# Patient Record
Sex: Female | Born: 1995 | Race: White | Hispanic: No | Marital: Single | Smoking: Never smoker
Health system: Southern US, Community
[De-identification: ages and names within clinical notes are randomized; demographics above are authoritative.]

## PROBLEM LIST (undated history)

## (undated) ENCOUNTER — Inpatient Hospital Stay (HOSPITAL_COMMUNITY): Payer: Self-pay

## (undated) DIAGNOSIS — F603 Borderline personality disorder: Secondary | ICD-10-CM

## (undated) DIAGNOSIS — F419 Anxiety disorder, unspecified: Secondary | ICD-10-CM

## (undated) DIAGNOSIS — F319 Bipolar disorder, unspecified: Secondary | ICD-10-CM

## (undated) DIAGNOSIS — F329 Major depressive disorder, single episode, unspecified: Secondary | ICD-10-CM

## (undated) DIAGNOSIS — R636 Underweight: Secondary | ICD-10-CM

## (undated) DIAGNOSIS — K219 Gastro-esophageal reflux disease without esophagitis: Secondary | ICD-10-CM

## (undated) DIAGNOSIS — F32A Depression, unspecified: Secondary | ICD-10-CM

## (undated) HISTORY — PX: NO PAST SURGERIES: SHX2092

## (undated) HISTORY — DX: Borderline personality disorder: F60.3

---

## 2015-06-13 ENCOUNTER — Other Ambulatory Visit: Payer: Self-pay | Admitting: Neurosurgery

## 2015-06-17 HISTORY — PX: BACK SURGERY: SHX140

## 2015-06-19 ENCOUNTER — Encounter (HOSPITAL_COMMUNITY)
Admission: RE | Admit: 2015-06-19 | Discharge: 2015-06-19 | Disposition: A | Discharge status: Home / Self Care | Payer: Managed Care, Other (non HMO) | Source: Ambulatory Visit | Attending: Neurosurgery | Admitting: Neurosurgery

## 2015-06-19 ENCOUNTER — Encounter (HOSPITAL_COMMUNITY): Payer: Self-pay

## 2015-06-19 DIAGNOSIS — Z01812 Encounter for preprocedural laboratory examination: Secondary | ICD-10-CM | POA: Insufficient documentation

## 2015-06-19 DIAGNOSIS — Z0183 Encounter for blood typing: Secondary | ICD-10-CM | POA: Diagnosis not present

## 2015-06-19 DIAGNOSIS — M4310 Spondylolisthesis, site unspecified: Secondary | ICD-10-CM | POA: Insufficient documentation

## 2015-06-19 HISTORY — DX: Underweight: R63.6

## 2015-06-19 HISTORY — DX: Anxiety disorder, unspecified: F41.9

## 2015-06-19 HISTORY — DX: Major depressive disorder, single episode, unspecified: F32.9

## 2015-06-19 HISTORY — DX: Bipolar disorder, unspecified: F31.9

## 2015-06-19 HISTORY — DX: Depression, unspecified: F32.A

## 2015-06-19 LAB — TYPE AND SCREEN
ABO/RH(D): O POS
Antibody Screen: NEGATIVE

## 2015-06-19 LAB — CBC WITH DIFFERENTIAL/PLATELET
Basophils Absolute: 0 10*3/uL (ref 0.0–0.1)
Basophils Relative: 0 %
EOS ABS: 0.1 10*3/uL (ref 0.0–0.7)
Eosinophils Relative: 1 %
HEMATOCRIT: 36.2 % (ref 36.0–46.0)
HEMOGLOBIN: 12.4 g/dL (ref 12.0–15.0)
LYMPHS ABS: 2 10*3/uL (ref 0.7–4.0)
LYMPHS PCT: 30 %
MCH: 28.8 pg (ref 26.0–34.0)
MCHC: 34.3 g/dL (ref 30.0–36.0)
MCV: 84 fL (ref 78.0–100.0)
MONOS PCT: 6 %
Monocytes Absolute: 0.4 10*3/uL (ref 0.1–1.0)
NEUTROS PCT: 63 %
Neutro Abs: 4.3 10*3/uL (ref 1.7–7.7)
Platelets: 280 10*3/uL (ref 150–400)
RBC: 4.31 MIL/uL (ref 3.87–5.11)
RDW: 11.6 % (ref 11.5–15.5)
WBC: 6.8 10*3/uL (ref 4.0–10.5)

## 2015-06-19 LAB — SURGICAL PCR SCREEN
MRSA, PCR: NEGATIVE
Staphylococcus aureus: POSITIVE — AB

## 2015-06-19 LAB — HCG, SERUM, QUALITATIVE: PREG SERUM: NEGATIVE

## 2015-06-19 LAB — ABO/RH: ABO/RH(D): O POS

## 2015-06-19 NOTE — Progress Notes (Signed)
[facility] called in [facility] in [place], [place] for positive PCR of Staph. Pt notified and voiced understanding.

## 2015-06-19 NOTE — Progress Notes (Signed)
PCP - Dr. Margo Commonapper Cardiologist - denies  EKG/CXR - denies Echo/stress test/cardiac cath - denies  Patient denies chest pain and shortness of breath at PAT appointment.

## 2015-06-19 NOTE — Pre-Procedure Instructions (Signed)
    Toni HutchingBrandi D Watson  06/19/2015      [facility] [address] - [place], [place] - [address] [place] [address] Phone: [phone] Fax: [phone]    Your procedure is scheduled on Monday, May 8th, 2017.  [facility] Admitting at 10:00 A.M.   Call this number if you have problems the morning of surgery:  [phone]   Remember:  Do not eat food or drink liquids after midnight.   Take these medicines the morning of surgery with A SIP OF WATER: None.  Stop taking: Aspirin, NSAIDS, Aleve, Naproxen, Ibuprofen, Advil, Motrin, BC's, Goody's, Fish oil, all herbal medications, and all vitamins.    Do not wear jewelry, make-up or nail polish.  Do not wear lotions, powders, or perfumes.  You may NOT wear deodorant.  Do not shave 48 hours prior to surgery.    Do not bring valuables to the hospital.   [facility] is not responsible for any belongings or valuables.  Contacts, dentures or bridgework may not be worn into surgery.  Leave your suitcase in the car.  After surgery it may be brought to your room.  For patients admitted to the hospital, discharge time will be determined by your treatment team.  Patients discharged the day of surgery will not be allowed to drive home.   Special instructions:  See attached.   Please read over the following fact sheets that you were given. Pain Booklet, Coughing and Deep Breathing, Blood Transfusion Information, MRSA Information and Surgical Site Infection Prevention

## 2015-06-24 ENCOUNTER — Observation Stay (HOSPITAL_COMMUNITY)
Admission: RE | Admit: 2015-06-24 | Discharge: 2015-06-25 | Disposition: A | Discharge status: Home / Self Care | Payer: Managed Care, Other (non HMO) | Source: Ambulatory Visit | Attending: Neurosurgery | Admitting: Neurosurgery

## 2015-06-24 ENCOUNTER — Inpatient Hospital Stay (HOSPITAL_COMMUNITY): Payer: Managed Care, Other (non HMO) | Admitting: Certified Registered Nurse Anesthetist

## 2015-06-24 ENCOUNTER — Encounter (HOSPITAL_COMMUNITY)
Admission: RE | Disposition: A | Discharge status: Home / Self Care | Payer: Self-pay | Source: Ambulatory Visit | Attending: Neurosurgery

## 2015-06-24 ENCOUNTER — Inpatient Hospital Stay (HOSPITAL_COMMUNITY): Payer: Managed Care, Other (non HMO)

## 2015-06-24 ENCOUNTER — Encounter (HOSPITAL_COMMUNITY): Payer: Self-pay | Admitting: *Deleted

## 2015-06-24 DIAGNOSIS — F329 Major depressive disorder, single episode, unspecified: Secondary | ICD-10-CM | POA: Insufficient documentation

## 2015-06-24 DIAGNOSIS — F419 Anxiety disorder, unspecified: Secondary | ICD-10-CM | POA: Diagnosis not present

## 2015-06-24 DIAGNOSIS — F319 Bipolar disorder, unspecified: Secondary | ICD-10-CM | POA: Diagnosis not present

## 2015-06-24 DIAGNOSIS — R262 Difficulty in walking, not elsewhere classified: Secondary | ICD-10-CM | POA: Insufficient documentation

## 2015-06-24 DIAGNOSIS — M4317 Spondylolisthesis, lumbosacral region: Principal | ICD-10-CM | POA: Insufficient documentation

## 2015-06-24 DIAGNOSIS — Z419 Encounter for procedure for purposes other than remedying health state, unspecified: Secondary | ICD-10-CM

## 2015-06-24 SURGERY — POSTERIOR LUMBAR FUSION 1 LEVEL
Anesthesia: General | Site: Back

## 2015-06-24 MED ORDER — SODIUM CHLORIDE 0.9 % IR SOLN
Status: DC | PRN
  Administered 2015-06-24: 500 mL

## 2015-06-24 MED ORDER — ACETAMINOPHEN 325 MG PO TABS: 650.0000 mg | ORAL_TABLET | ORAL | Status: DC | PRN

## 2015-06-24 MED ORDER — NEOSTIGMINE METHYLSULFATE 10 MG/10ML IV SOLN
INTRAVENOUS | Status: DC | PRN
  Administered 2015-06-24: 3 mg via INTRAVENOUS

## 2015-06-24 MED ORDER — CEFAZOLIN SODIUM-DEXTROSE 2-4 GM/100ML-% IV SOLN
INTRAVENOUS | Status: AC
  Administered 2015-06-24: 2 g via INTRAVENOUS
  Filled 2015-06-24: qty 100

## 2015-06-24 MED ORDER — PROPOFOL 10 MG/ML IV BOLUS
INTRAVENOUS | Status: DC | PRN
  Administered 2015-06-24: 200 mg via INTRAVENOUS

## 2015-06-24 MED ORDER — MIDAZOLAM HCL 5 MG/5ML IJ SOLN
INTRAMUSCULAR | Status: DC | PRN
  Administered 2015-06-24: 0.5 mg via INTRAVENOUS

## 2015-06-24 MED ORDER — HYDROMORPHONE HCL 1 MG/ML IJ SOLN
0.5000 mg | INTRAMUSCULAR | Status: DC | PRN
  Administered 2015-06-24: 1 mg via INTRAVENOUS
  Filled 2015-06-24: qty 1

## 2015-06-24 MED ORDER — ACETAMINOPHEN 10 MG/ML IV SOLN
INTRAVENOUS | Status: AC
  Administered 2015-06-24: 1000 mg via INTRAVENOUS
  Filled 2015-06-24: qty 100

## 2015-06-24 MED ORDER — DEXAMETHASONE SODIUM PHOSPHATE 10 MG/ML IJ SOLN
INTRAMUSCULAR | Status: AC
  Filled 2015-06-24: qty 1

## 2015-06-24 MED ORDER — ARTIFICIAL TEARS OP OINT
TOPICAL_OINTMENT | OPHTHALMIC | Status: DC | PRN
  Administered 2015-06-24: 1 via OPHTHALMIC

## 2015-06-24 MED ORDER — VANCOMYCIN HCL 1000 MG IV SOLR
INTRAVENOUS | Status: DC | PRN
  Administered 2015-06-24: 1000 mg

## 2015-06-24 MED ORDER — PHENOL 1.4 % MT LIQD: 1.0000 | OROMUCOSAL | Status: DC | PRN

## 2015-06-24 MED ORDER — GLYCOPYRROLATE 0.2 MG/ML IJ SOLN
INTRAMUSCULAR | Status: DC | PRN
  Administered 2015-06-24: .4 mg via INTRAVENOUS

## 2015-06-24 MED ORDER — SODIUM CHLORIDE 0.9% FLUSH
3.0000 mL | Freq: Two times a day (BID) | INTRAVENOUS | Status: DC
  Administered 2015-06-24: 3 mL via INTRAVENOUS

## 2015-06-24 MED ORDER — LACTATED RINGERS IV SOLN
INTRAVENOUS | Status: DC
Start: 2015-06-24 — End: 2015-06-24
  Administered 2015-06-24 (×3): via INTRAVENOUS

## 2015-06-24 MED ORDER — CEFAZOLIN SODIUM 1-5 GM-% IV SOLN
1.0000 g | Freq: Three times a day (TID) | INTRAVENOUS | Status: AC
  Administered 2015-06-24 – 2015-06-25 (×2): 1 g via INTRAVENOUS
  Filled 2015-06-24 (×2): qty 50

## 2015-06-24 MED ORDER — HYDROMORPHONE HCL 1 MG/ML IJ SOLN
0.5000 mg | INTRAMUSCULAR | Status: DC | PRN
  Administered 2015-06-24 (×2): 0.5 mg via INTRAVENOUS

## 2015-06-24 MED ORDER — PHENYLEPHRINE HCL 10 MG/ML IJ SOLN
INTRAMUSCULAR | Status: DC | PRN
  Administered 2015-06-24: 40 ug via INTRAVENOUS
  Administered 2015-06-24 (×2): 80 ug via INTRAVENOUS
  Administered 2015-06-24 (×5): 40 ug via INTRAVENOUS

## 2015-06-24 MED ORDER — SURGIFOAM 100 EX MISC
CUTANEOUS | Status: DC | PRN
  Administered 2015-06-24: 20 mL via TOPICAL

## 2015-06-24 MED ORDER — ACETAMINOPHEN 650 MG RE SUPP: 650.0000 mg | RECTAL | Status: DC | PRN

## 2015-06-24 MED ORDER — ONDANSETRON HCL 4 MG/2ML IJ SOLN
4.0000 mg | Freq: Once | INTRAMUSCULAR | Status: DC | PRN
Start: 2015-06-24 — End: 2015-06-24

## 2015-06-24 MED ORDER — KETOROLAC TROMETHAMINE 30 MG/ML IJ SOLN
INTRAMUSCULAR | Status: DC | PRN
  Administered 2015-06-24: 30 mg via INTRAVENOUS

## 2015-06-24 MED ORDER — FENTANYL CITRATE (PF) 100 MCG/2ML IJ SOLN
INTRAMUSCULAR | Status: DC | PRN
  Administered 2015-06-24: 50 ug via INTRAVENOUS
  Administered 2015-06-24 (×2): 25 ug via INTRAVENOUS
  Administered 2015-06-24: 100 ug via INTRAVENOUS

## 2015-06-24 MED ORDER — VANCOMYCIN HCL 1000 MG IV SOLR
INTRAVENOUS | Status: AC
  Filled 2015-06-24: qty 1000

## 2015-06-24 MED ORDER — MIDAZOLAM HCL 2 MG/2ML IJ SOLN
INTRAMUSCULAR | Status: AC
  Filled 2015-06-24: qty 2

## 2015-06-24 MED ORDER — OXYCODONE-ACETAMINOPHEN 5-325 MG PO TABS
1.0000 | ORAL_TABLET | ORAL | Status: DC | PRN
  Administered 2015-06-25 (×2): 2 via ORAL
  Filled 2015-06-24 (×2): qty 2

## 2015-06-24 MED ORDER — ONDANSETRON HCL 4 MG/2ML IJ SOLN
4.0000 mg | INTRAMUSCULAR | Status: DC | PRN
  Administered 2015-06-24: 4 mg via INTRAVENOUS
  Filled 2015-06-24: qty 2

## 2015-06-24 MED ORDER — ONDANSETRON HCL 4 MG/2ML IJ SOLN
INTRAMUSCULAR | Status: DC | PRN
  Administered 2015-06-24: 4 mg via INTRAVENOUS

## 2015-06-24 MED ORDER — MENTHOL 3 MG MT LOZG: 1.0000 | LOZENGE | OROMUCOSAL | Status: DC | PRN

## 2015-06-24 MED ORDER — DIAZEPAM 5 MG PO TABS
5.0000 mg | ORAL_TABLET | Freq: Four times a day (QID) | ORAL | Status: DC | PRN
  Administered 2015-06-25: 5 mg via ORAL
  Filled 2015-06-24: qty 1

## 2015-06-24 MED ORDER — 0.9 % SODIUM CHLORIDE (POUR BTL) OPTIME
TOPICAL | Status: DC | PRN
  Administered 2015-06-24: 1000 mL

## 2015-06-24 MED ORDER — LIDOCAINE HCL (CARDIAC) 20 MG/ML IV SOLN
INTRAVENOUS | Status: DC | PRN
  Administered 2015-06-24: 40 mg via INTRAVENOUS

## 2015-06-24 MED ORDER — DEXAMETHASONE SODIUM PHOSPHATE 10 MG/ML IJ SOLN: 10.0000 mg | INTRAMUSCULAR | Status: DC

## 2015-06-24 MED ORDER — BUPIVACAINE HCL (PF) 0.25 % IJ SOLN
INTRAMUSCULAR | Status: DC | PRN
  Administered 2015-06-24: 20 mL

## 2015-06-24 MED ORDER — HYDROCODONE-ACETAMINOPHEN 5-325 MG PO TABS: 1.0000 | ORAL_TABLET | ORAL | Status: DC | PRN

## 2015-06-24 MED ORDER — SODIUM CHLORIDE 0.9% FLUSH: 3.0000 mL | INTRAVENOUS | Status: DC | PRN

## 2015-06-24 MED ORDER — FENTANYL CITRATE (PF) 250 MCG/5ML IJ SOLN
INTRAMUSCULAR | Status: AC
  Filled 2015-06-24: qty 5

## 2015-06-24 MED ORDER — PROPOFOL 10 MG/ML IV BOLUS
INTRAVENOUS | Status: AC
  Filled 2015-06-24: qty 20

## 2015-06-24 MED ORDER — ROCURONIUM BROMIDE 100 MG/10ML IV SOLN
INTRAVENOUS | Status: DC | PRN
  Administered 2015-06-24: 30 mg via INTRAVENOUS
  Administered 2015-06-24: 20 mg via INTRAVENOUS

## 2015-06-24 MED ORDER — NORETHINDRONE ACET-ETHINYL EST 1-20 MG-MCG PO TABS: 1.0000 | ORAL_TABLET | Freq: Every day | ORAL | Status: DC

## 2015-06-24 MED ORDER — HYDROMORPHONE HCL 1 MG/ML IJ SOLN
INTRAMUSCULAR | Status: AC
  Filled 2015-06-24: qty 1

## 2015-06-24 MED ORDER — CEFAZOLIN SODIUM-DEXTROSE 2-4 GM/100ML-% IV SOLN: 2.0000 g | INTRAVENOUS | Status: DC

## 2015-06-24 SURGICAL SUPPLY — 58 items
BAG DECANTER FOR FLEXI CONT (MISCELLANEOUS) ×3 IMPLANT
BENZOIN TINCTURE PRP APPL 2/3 (GAUZE/BANDAGES/DRESSINGS) ×3 IMPLANT
BLADE CLIPPER SURG (BLADE) IMPLANT
BRUSH SCRUB EZ PLAIN DRY (MISCELLANEOUS) ×3 IMPLANT
BUR CUTTER 7.0 ROUND (BURR) IMPLANT
BUR MATCHSTICK NEURO 3.0 LAGG (BURR) ×3 IMPLANT
CANISTER SUCT 3000ML PPV (MISCELLANEOUS) ×3 IMPLANT
CAP LCK SPNE (Orthopedic Implant) ×4 IMPLANT
CAP LOCK SPINE RADIUS (Orthopedic Implant) ×4 IMPLANT
CAP LOCKING (Orthopedic Implant) ×8 IMPLANT
CLOSURE WOUND 1/2 X4 (GAUZE/BANDAGES/DRESSINGS) ×1
CONT SPEC 4OZ CLIKSEAL STRL BL (MISCELLANEOUS) ×3 IMPLANT
COVER BACK TABLE 60X90IN (DRAPES) ×3 IMPLANT
DECANTER SPIKE VIAL GLASS SM (MISCELLANEOUS) ×3 IMPLANT
DEVICE INTERBODY ELEVATE 23X10 (Cage) ×6 IMPLANT
DRAPE C-ARM 42X72 X-RAY (DRAPES) ×6 IMPLANT
DRAPE LAPAROTOMY 100X72X124 (DRAPES) ×3 IMPLANT
DRAPE POUCH INSTRU U-SHP 10X18 (DRAPES) ×3 IMPLANT
DRAPE PROXIMA HALF (DRAPES) IMPLANT
DRAPE SURG 17X23 STRL (DRAPES) ×12 IMPLANT
DRSG OPSITE POSTOP 4X6 (GAUZE/BANDAGES/DRESSINGS) ×3 IMPLANT
DURAPREP 26ML APPLICATOR (WOUND CARE) ×3 IMPLANT
ELECT REM PT RETURN 9FT ADLT (ELECTROSURGICAL) ×3
ELECTRODE REM PT RTRN 9FT ADLT (ELECTROSURGICAL) ×1 IMPLANT
EVACUATOR 1/8 PVC DRAIN (DRAIN) ×3 IMPLANT
GAUZE SPONGE 4X4 12PLY STRL (GAUZE/BANDAGES/DRESSINGS) ×3 IMPLANT
GAUZE SPONGE 4X4 16PLY XRAY LF (GAUZE/BANDAGES/DRESSINGS) IMPLANT
GLOVE ECLIPSE 9.0 STRL (GLOVE) ×6 IMPLANT
GLOVE EXAM NITRILE LRG STRL (GLOVE) IMPLANT
GLOVE EXAM NITRILE MD LF STRL (GLOVE) IMPLANT
GLOVE EXAM NITRILE XL STR (GLOVE) IMPLANT
GLOVE EXAM NITRILE XS STR PU (GLOVE) IMPLANT
GOWN STRL REUS W/ TWL LRG LVL3 (GOWN DISPOSABLE) IMPLANT
GOWN STRL REUS W/ TWL XL LVL3 (GOWN DISPOSABLE) ×2 IMPLANT
GOWN STRL REUS W/TWL 2XL LVL3 (GOWN DISPOSABLE) IMPLANT
GOWN STRL REUS W/TWL LRG LVL3 (GOWN DISPOSABLE)
GOWN STRL REUS W/TWL XL LVL3 (GOWN DISPOSABLE) ×4
KIT BASIN OR (CUSTOM PROCEDURE TRAY) ×3 IMPLANT
KIT ROOM TURNOVER OR (KITS) ×3 IMPLANT
LIQUID BAND (GAUZE/BANDAGES/DRESSINGS) ×3 IMPLANT
NEEDLE HYPO 22GX1.5 SAFETY (NEEDLE) ×3 IMPLANT
NS IRRIG 1000ML POUR BTL (IV SOLUTION) ×3 IMPLANT
PACK LAMINECTOMY NEURO (CUSTOM PROCEDURE TRAY) ×3 IMPLANT
ROD RADIUS 35MM (Rod) ×3 IMPLANT
ROD RADIUS 40MM (Neuro Prosthesis/Implant) ×2 IMPLANT
ROD SPNL 40X5.5XNS TI RDS (Neuro Prosthesis/Implant) ×1 IMPLANT
SCREW 5.75X40M (Screw) ×6 IMPLANT
SCREW 6.75X40MM (Screw) ×6 IMPLANT
SPONGE SURGIFOAM ABS GEL 100 (HEMOSTASIS) ×3 IMPLANT
STRIP CLOSURE SKIN 1/2X4 (GAUZE/BANDAGES/DRESSINGS) ×2 IMPLANT
SUT VIC AB 0 CT1 18XCR BRD8 (SUTURE) ×2 IMPLANT
SUT VIC AB 0 CT1 8-18 (SUTURE) ×4
SUT VIC AB 2-0 CT1 18 (SUTURE) ×3 IMPLANT
SUT VIC AB 3-0 SH 8-18 (SUTURE) ×6 IMPLANT
TOWEL OR 17X24 6PK STRL BLUE (TOWEL DISPOSABLE) ×3 IMPLANT
TOWEL OR 17X26 10 PK STRL BLUE (TOWEL DISPOSABLE) ×3 IMPLANT
TRAY FOLEY W/METER SILVER 14FR (SET/KITS/TRAYS/PACK) ×3 IMPLANT
WATER STERILE IRR 1000ML POUR (IV SOLUTION) ×3 IMPLANT

## 2015-06-24 NOTE — Anesthesia Procedure Notes (Signed)
Procedure Name: Intubation Date/Time: 06/24/2015 1:22 PM Performed by: Dairl PonderJIANG, Evonda Enge Pre-anesthesia Checklist: Patient identified, Timeout performed, Emergency Drugs available, Suction available and Patient being monitored Patient Re-evaluated:Patient Re-evaluated prior to inductionOxygen Delivery Method: Circle system utilized Preoxygenation: Pre-oxygenation with 100% oxygen Intubation Type: IV induction Ventilation: Mask ventilation with difficulty Laryngoscope Size: Mac and 3 Grade View: Grade I Tube type: Oral Tube size: 7.0 mm Number of attempts: 1 Airway Equipment and Method: Stylet Placement Confirmation: ETT inserted through vocal cords under direct vision,  positive ETCO2 and breath sounds checked- equal and bilateral Secured at: 22 cm Tube secured with: Tape Dental Injury: Teeth and Oropharynx as per pre-operative assessment

## 2015-06-24 NOTE — Anesthesia Postprocedure Evaluation (Signed)
Anesthesia Post Note  Patient: Donnetta HutchingBrandi D Cunning  Procedure(s) Performed: Procedure(s) (LRB): Posterior Lumbar Interbody Fusion Lumbar five-Sacral one (N/A)  Patient location during evaluation: PACU Anesthesia Type: General Level of consciousness: awake and alert Pain management: pain level controlled Vital Signs Assessment: post-procedure vital signs reviewed and stable Respiratory status: spontaneous breathing, nonlabored ventilation, respiratory function stable and patient connected to nasal cannula oxygen Cardiovascular status: blood pressure returned to baseline and stable Postop Assessment: no signs of nausea or vomiting Anesthetic complications: no    Last Vitals:  Filed Vitals:   06/24/15 1700 06/24/15 1715  BP: 107/59 111/67  Pulse: 77 93  Temp:    Resp: 18 17    Last Pain:  Filed Vitals:   06/24/15 1719  PainSc: 7     LLE Motor Response: Purposeful movement;Responds to commands (06/24/15 1715)   RLE Motor Response: Purposeful movement;Responds to commands (06/24/15 1715)        Kassity Woodson,W. EDMOND

## 2015-06-24 NOTE — Transfer of Care (Signed)
Immediate Anesthesia Transfer of Care Note  Patient: Toni Watson  Procedure(s) Performed: Procedure(s): Posterior Lumbar Interbody Fusion Lumbar five-Sacral one (N/A)  Patient Location: PACU  Anesthesia Type:General  Level of Consciousness: awake, alert  and oriented  Airway & Oxygen Therapy: Patient Spontanous Breathing and Patient connected to nasal cannula oxygen  Post-op Assessment: Report given to RN and Post -op Vital signs reviewed and stable  Post vital signs: Reviewed and stable  Last Vitals:  Filed Vitals:   06/24/15 1145 06/24/15 1619  BP: 130/85 116/67  Pulse: 93 90  Temp: 36.6 C 36.5 C  Resp: 18 14    Last Pain:  Filed Vitals:   06/24/15 1620  PainSc: 4          Complications: No apparent anesthesia complications

## 2015-06-24 NOTE — Op Note (Signed)
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  Surgeon:Kyelle Urbas A.Allecia Bells, M.D.  Asst. Surgeon: Ditty  Anesthesia: General  Indication: 20 year old female with back and bilateral lower extremity pain failing conservative management. Workup demonstrates evidence of a dysplastic spondylolisthesis with severe stenosis at L5-S1. Patient presents now for decompression and fusion in hopes of improving her symptoms.  Operative note: After induction of anesthesia, patient position prone onto Wilson frame and appropriately padded. Lumbar region prepped and draped sterilely. Incision made overlying L5-S1. Dissection performed bilaterally. Retractor placed. Fluoroscopy used. Level confirmed. The posterior elements of L5 and S1 were misshapen and dysplastic. Decompressive laminectomy and facetectomies were performed bilaterally of L5. Superior facetectomies of S1 were also performed. Ligament flavum and pannus-like material was elevated and resected. Aggressive foraminotomies were performed along the course the exiting L5 and S1 nerve roots bilaterally. Disc space was isolated and is incised. Discectomies then performed with various instruments to remove all elements of the disc and disc herniation at L5-S1. Pedicles of L5 and S1 were done 5 using surface landmarks and intraoperative fluoroscopy. Superficial bone overlying the pedicle was removed using high-speed drill. Each pedicle was then probed using a pedicle awl  each pedicle awl track was tapped. Radius brand screws from [employer] were placed bilaterally at L5 and S1. Disc space was prepared for interbody fusion. Disc space was distracted up to 10 mm. With a 10 mm distractor left and patient's left side thecal sac and nerve roots were protected on the contralateral side. A 10 mm x 12 expandable cage packed with local autograft was impacted into place and expanded into its full extent. Distractor was removed patient's left side. Disc space was once again prepared on the left. Morselize autograft was packed into the interspace. A second cage packed with autograft was impacted in place and expanded to its full extent. Both cages were centrally flush with the posterior margin of L5. It was difficult to get them to advance further than this. Wounds and irrigated with and like solution. Final images revealed good position the cages and the screws at the proper upper level with normal alignment is fine. Transverse processes and residual facets were decorticated. Morselize autograft was packed posterior laterally. Short segment titanium rods and placed over the screw heads at L5  and S1. Locking caps and placed over the screws. Locking caps and engaged with the construct under compression. Vancomycin powder was placed the deep wound space. Wounds and close in layers with Vicryl sutures. Steri-Strips and sterile dressing were applied. There were no apparent complications. Patient tolerated the procedure well and she returns to the recovery room postop.

## 2015-06-24 NOTE — Anesthesia Preprocedure Evaluation (Signed)
Anesthesia Evaluation  Patient identified by MRN, date of birth, ID band Patient awake    Reviewed: Allergy & Precautions, NPO status , Patient's Chart, lab work & pertinent test results  Airway Mallampati: I  TM Distance: >3 FB Neck ROM: Full    Dental   Pulmonary    Pulmonary exam normal        Cardiovascular Normal cardiovascular exam     Neuro/Psych Depression Bipolar Disorder    GI/Hepatic   Endo/Other    Renal/GU      Musculoskeletal   Abdominal   Peds  Hematology   Anesthesia Other Findings   Reproductive/Obstetrics                             Anesthesia Physical Anesthesia Plan  ASA: I  Anesthesia Plan: General   Post-op Pain Management:    Induction: Intravenous  Airway Management Planned: Oral ETT  Additional Equipment:   Intra-op Plan:   Post-operative Plan: Extubation in OR  Informed Consent: I have reviewed the patients History and Physical, chart, labs and discussed the procedure including the risks, benefits and alternatives for the proposed anesthesia with the patient or authorized representative who has indicated his/her understanding and acceptance.     Plan Discussed with: CRNA, Anesthesiologist and Surgeon  Anesthesia Plan Comments:         Anesthesia Quick Evaluation

## 2015-06-24 NOTE — H&P (Signed)
  Toni Watson is an 20 y.o. female.   Chief Complaint: Back pain HPI: 20 year old female with severe back and bilateral lower extremity pain failing conservative management. Workup demonstrates evidence of a dysplastic spondylolisthesis at L5-S1 with severe stenosis. Patient has failed conservative management presents now for decompression and fusion.  Past Medical History  Diagnosis Date  . Underweight     per patient  . Bipolar disorder (HCC)   . Depression   . Anxiety     Past Surgical History  Procedure Laterality Date  . No past surgeries      No family history on file. Social History:  reports that she has never smoked. She has never used smokeless tobacco. She reports that she does not drink alcohol or use illicit drugs.  Allergies: Not on File  No prescriptions prior to admission    No results found for this or any previous visit (from the past 48 hour(s)). No results found.  Pertinent items noted in HPI and remainder of comprehensive ROS otherwise negative.  There were no vitals taken for this visit.  The patient is awake and alert. She is oriented and appropriate. Cranial nerve function is intact. Motor and sensory function extremities are normal. Chest and abdomen are benign. Extremities are free from injury or deformity. 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 Plan bilateral L5-S1 decompressive laminectomy and foraminotomies followed by posterior lumbar interbody fusion utilizing expandable cages and local autografting coupled with posterior lateral arthrodesis utilizing nonsegmental pedicle screw fixation and local autografting. Risks and benefits been explained. Patient wishes to proceed.  Daurice Ovando A 06/24/2015, 8:55 AM

## 2015-06-24 NOTE — Brief Op Note (Signed)
06/24/2015  4:02 PM  PATIENT:  Toni Watson  20 y.o. female  PRE-OPERATIVE DIAGNOSIS:  Spondylolisthesis  POST-OPERATIVE DIAGNOSIS:  Spondylolisthesis  PROCEDURE:  Procedure(s): Posterior Lumbar Interbody Fusion Lumbar five-Sacral one (N/A)  SURGEON:  Surgeon(s) and Role:    * Julio SicksHenry Jarae Panas, MD - Primary    * Loura HaltBenjamin Jared Ditty, MD - Assisting  PHYSICIAN ASSISTANT:   ASSISTANTS:    ANESTHESIA:   general  EBL:  Total I/O In: 1000 [I.V.:1000] Out: 250 [Urine:150; Blood:100]  BLOOD ADMINISTERED:none  DRAINS: none   LOCAL MEDICATIONS USED:  MARCAINE     SPECIMEN:  No Specimen  DISPOSITION OF SPECIMEN:  N/A  COUNTS:  YES  TOURNIQUET:  * No tourniquets in log *  DICTATION: .Dragon Dictation  PLAN OF CARE: Admit to inpatient   PATIENT DISPOSITION:  PACU - hemodynamically stable.   Delay start of Pharmacological VTE agent (>24hrs) due to surgical blood loss or risk of bleeding: yes

## 2015-06-24 NOTE — Progress Notes (Signed)
When removing earring on left earlobe that patient reports that she was unable to remove. Noted slight bleeding post removal. Cleaned area with alcohol. Patient with 2- 3 mm abrasion to left ear. Victorino DikeJennifer in neuro Or notified

## 2015-06-25 DIAGNOSIS — M4317 Spondylolisthesis, lumbosacral region: Secondary | ICD-10-CM | POA: Diagnosis not present

## 2015-06-25 MED ORDER — OXYCODONE-ACETAMINOPHEN 5-325 MG PO TABS: 1.0000 | ORAL_TABLET | ORAL | Status: DC | PRN

## 2015-06-25 MED ORDER — DIAZEPAM 5 MG PO TABS: 5.0000 mg | ORAL_TABLET | Freq: Four times a day (QID) | ORAL | Status: DC | PRN

## 2015-06-25 NOTE — Progress Notes (Signed)
Pt doing well. Pt and father given D/C instructions with Rx's, verbal understanding was provided. Pt's incision is clean and dry with no sign of infection. Pt's IV was removed prior to D/C. Pt D/C'd home via wheelchair @ 1440 per MD order. Pt is stable @ D/C and has no other needs at this time. Rema FendtAshley Lindsy Cerullo, RN

## 2015-06-25 NOTE — Evaluation (Signed)
Occupational Therapy Evaluation Patient Details Name: Toni Watson MRN: [id number] DOB: [birth year] Today's Date: 06/25/2015    History of Present Illness Pt is a 20 y/o female who presents s/p L5-S1 PLIF on 06/24/15.   Clinical Impression   Patient evaluated by Occupational Therapy with no further acute OT needs identified. All education has been completed and the patient has no further questions. See below for any follow-up Occupational Therapy or equipment needs. OT to sign off. Thank you for referral.      Follow Up Recommendations  No OT follow up    Equipment Recommendations  None recommended by OT    Recommendations for Other Services       Precautions / Restrictions Precautions Precautions: Fall;Back Precaution Booklet Issued: Yes (comment) Precaution Comments: Reviewed handout.  Restrictions Weight Bearing Restrictions: No      Mobility Bed Mobility Overal bed mobility: Modified Independent             General bed mobility comments: Pt received walking in hall with OT  Transfers Overall transfer level: Modified independent Equipment used: None             General transfer comment: VC's for hand placement on seated surface for safety and maintenance of back precautions.     Balance Overall balance assessment: No apparent balance deficits (not formally assessed)                                          ADL Overall ADL's : Modified independent                                       General ADL Comments: educated on peri hygiene with back precautions. educated on use of a robe for bathing in case father needs to (A). Pt able to don doff brace independent     Vision Vision Assessment?: No apparent visual deficits   Perception     Praxis      Pertinent Vitals/Pain Pain Assessment: No/denies pain Faces Pain Scale: Hurts a little bit Pain Location: Incision, R glute Pain Descriptors / Indicators: Operative  site guarding;Sore Pain Intervention(s): Limited activity within patient's tolerance;Monitored during session;Repositioned     Hand Dominance Right   Extremity/Trunk Assessment Upper Extremity Assessment Upper Extremity Assessment: Overall WFL for tasks assessed   Lower Extremity Assessment Lower Extremity Assessment: Defer to PT evaluation   Cervical / Trunk Assessment Cervical / Trunk Assessment: Normal   Communication Communication Communication: No difficulties   Cognition Arousal/Alertness: Awake/alert Behavior During Therapy: WFL for tasks assessed/performed Overall Cognitive Status: Within Functional Limits for tasks assessed                     General Comments       Exercises       Shoulder Instructions      Home Living Family/patient expects to be discharged to:: Private residence Living Arrangements: Parent Available Help at Discharge: Family Type of Home: House Home Access: Stairs to enter Secretary/administratorntrance Stairs-Number of Steps: 2 Entrance Stairs-Rails: None Home Layout: One level     Bathroom Shower/Tub: Chief Strategy OfficerTub/shower unit   Bathroom Toilet: Standard     Home Equipment: None          Prior Functioning/Environment Level of Independence: Independent  OT Diagnosis:     OT Problem List:     OT Treatment/Interventions:      OT Goals(Current goals can be found in the care plan section)    OT Frequency:     Barriers to D/C:            Co-evaluation              End of Session Equipment Utilized During Treatment: Gait belt  Activity Tolerance: Patient tolerated treatment well Patient left: Other (comment) (PT Toni Watson in hall)   Time: 1610-9604 OT Time Calculation (min): 22 min Charges:  OT General Charges $OT Visit: 1 Procedure OT Evaluation $OT Eval Low Complexity: 1 Procedure G-Codes:    Toni Watson 07/02/15, 1:30 PM   Toni Watson   OTR/L Pager: [phone] Office: [phone] .

## 2015-06-25 NOTE — Evaluation (Signed)
Physical Therapy Evaluation and Discharge Patient Details Name: Toni Watson MRN: [id number] DOB: [birth year] Today's Date: 06/25/2015   History of Present Illness  Pt is a 20 y/o female who presents s/p L5-S1 PLIF on 06/24/15.  Clinical Impression  Patient evaluated by Physical Therapy with no further acute PT needs identified. All education has been completed and the patient has no further questions. At the time of PT eval pt was able to perform transfers and ambulation with modified independence. Pt moving slow and guarded due to reported fear of pain, however overall functioning well. See below for any follow-up Physical Therapy or equipment needs. PT is signing off. Thank you for this referral.     Follow Up Recommendations Outpatient PT;Supervision for mobility/OOB    Equipment Recommendations  None recommended by PT    Recommendations for Other Services       Precautions / Restrictions Precautions Precautions: Fall;Back Precaution Booklet Issued: Yes (comment) Precaution Comments: Reviewed handout. Pt was cued for precautions during functional mobility.  Restrictions Weight Bearing Restrictions: No      Mobility  Bed Mobility               General bed mobility comments: Pt received walking in hall with OT  Transfers Overall transfer level: Modified independent Equipment used: None             General transfer comment: VC's for hand placement on seated surface for safety and maintenance of back precautions.   Ambulation/Gait Ambulation/Gait assistance: Modified independent (Device/Increase time) Ambulation Distance (Feet): 400 Feet Assistive device: None Gait Pattern/deviations: Step-through pattern;Decreased stride length;Narrow base of support Gait velocity: Decreased Gait velocity interpretation: Below normal speed for age/gender General Gait Details: Slow and guarded. No unsteadiness or LOB noted. Pt states she is "scared of the pain" and was noted  taking very narrow, short steps. Was able to improve somewhat with cues but continued to appear very guarded.   Stairs Stairs: Yes Stairs assistance: Modified independent (Device/Increase time) Stair Management: No rails;Step to pattern;Forwards Number of Stairs: 3 General stair comments: VC's for general sequencing and safety.  Wheelchair Mobility    Modified Rankin (Stroke Patients Only)       Balance Overall balance assessment: No apparent balance deficits (not formally assessed)                                           Pertinent Vitals/Pain Pain Assessment: Faces Faces Pain Scale: Hurts a little bit Pain Location: Incision, R glute Pain Descriptors / Indicators: Operative site guarding;Sore Pain Intervention(s): Limited activity within patient's tolerance;Monitored during session;Repositioned    Home Living         Home Access: Stairs to enter Entrance Stairs-Rails: None Entrance Stairs-Number of Steps: 2 Home Layout: One level        Prior Function Level of Independence: Independent               Hand Dominance        Extremity/Trunk Assessment   Upper Extremity Assessment: Defer to OT evaluation           Lower Extremity Assessment: Overall WFL for tasks assessed      Cervical / Trunk Assessment: Normal (lumbar surgical incision)  Communication   Communication: No difficulties  Cognition Arousal/Alertness: Awake/alert Behavior During Therapy: WFL for tasks assessed/performed Overall Cognitive Status: Within Functional Limits for tasks assessed  General Comments      Exercises        Assessment/Plan    PT Assessment Patent does not need any further PT services  PT Diagnosis Difficulty walking   PT Problem List    PT Treatment Interventions     PT Goals (Current goals can be found in the Care Plan section) Acute Rehab PT Goals PT Goal Formulation: All assessment and education  complete, DC therapy    Frequency     Barriers to discharge        Co-evaluation               End of Session Equipment Utilized During Treatment: Back brace Activity Tolerance: Patient tolerated treatment well Patient left: in chair;with call bell/phone within reach;with family/visitor present Nurse Communication: Mobility status         Time: 1610-9604 PT Time Calculation (min) (ACUTE ONLY): 22 min   Charges:   PT Evaluation $PT Eval Moderate Complexity: 1 Procedure     PT G Codes:        Conni Slipper Jul 20, 2015, 12:26 PM   Conni Slipper, PT, DPT Acute Rehabilitation Services Pager: [phone]

## 2015-06-25 NOTE — Discharge Instructions (Signed)
Wound Care °Keep incision covered and dry for two days.  If you shower, cover incision with plastic wrap.  °Do not put any creams, lotions, or ointments on incision. °Leave steri-strips on back.  They will fall off by themselves. °Activity °Walk each and every day, increasing distance each day. °No lifting greater than 5 lbs.  Avoid excessive neck motion. °No driving for 2 weeks; may ride as a passenger locally. °If provided with back brace, wear when out of bed.  It is not necessary to wear brace in bed. °Diet °Resume your normal diet.  °Return to Work °Will be discussed at you follow up appointment. °Call Your Doctor If Any of These Occur °Redness, drainage, or swelling at the wound.  °Temperature greater than 101 degrees. °Severe pain not relieved by pain medication. °Incision starts to come apart. °Follow Up Appt °Call today for appointment in 1-2 weeks ([phone]) or for problems.  If you have any hardware placed in your spine, you will need an x-ray before your appointment. ° °Spinal Fusion °Spinal fusion is a procedure to make 2 or more of the bones in your spinal column (vertebrae) grow together (fuse). This procedure stops movement between the vertebrae and can relieve pain and prevent deformity.  °Spinal fusion is used to treat the following conditions: °· Fractures of the spine. °· Herniated disk (the spongy material [cartilage] between the vertebrae). °· Abnormal curvatures of the spine, such as scoliosis or kyphosis. °· A weak or an unstable spine, caused by infections or tumor. °RISKS AND COMPLICATIONS °Complications associated with spinal fusion are rare, but they can occur. Possible complications include: °· Bleeding. °· Infection near the incision. °· Nerve damage. Signs of nerve damage are back pain, pain in one or both legs, weakness, or numbness. °· Spinal fluid leakage. °· Blood clot in your leg, which can move to your lungs. °· Difficulty controlling urination or bowel movements. °BEFORE THE  PROCEDURE °· A medical evaluation will be done. This will include a physical exam, blood tests, and imaging exams. °· You will talk with an anesthesiologist. This is the person who will be in charge of the anesthesia during the procedure. Spinal fusion usually requires that you are asleep during the procedure (general anesthesia). °· You will need to stop taking certain medicines, particularly those associated with an increased risk of bleeding. Ask your caregiver about changing or stopping your regular medicines. °· If you smoke, you will need to stop at least 2 weeks before the procedure. Smoking can slow down the healing process, especially fusion of the vertebrae, and increase the risk of complications. °· Do not eat or drink anything for at least 8 hours before the procedure. °PROCEDURE  °A cut (incision) is made over the vertebrae that will be fused. The back muscles are separated from the vertebrae. If you are having this procedure to treat a herniated disk, the disc material pressing on the nerve root is removed (decompression). The area where the disk is removed is then filled with extra bone. Bone from another part of your body (autogenous bone) or bone from a bone donor (allograft bone) may be used. The extra bone promotes fusion between the vertebrae. Sometimes, specific medicines are added to the fusion area to promote bone healing. In most cases, screws and rods or metal plates will be used to attach the vertebrae to stabilize them while they fuse.  °AFTER THE PROCEDURE  °· You will stay in a recovery area until the anesthesia has worn off.   Your blood pressure and pulse will be checked frequently. °· You will be given antibiotics to prevent infection. °· You may continue to receive fluids through an intravenous (IV) tube while you are still in the hospital. °· Pain after surgery is normal. You will be given pain medicine. °· You will be taught how to move correctly and how to stand and walk. While in  bed, you will be instructed to turn frequently, using a "log rolling" technique, in which the entire body is moved without twisting the back. °  °This information is not intended to replace advice given to you by your health care provider. Make sure you discuss any questions you have with your health care provider. °  °Document Released: 11/01/2002 Document Revised: 04/27/2011 Document Reviewed: 07/18/2014 °Elsevier Interactive Patient Education ©2016 [employer] ° °

## 2015-06-25 NOTE — Discharge Summary (Signed)
Physician Discharge Summary  Patient ID: Donnetta HutchingBrandi D Parkin MRN: [id number] DOB/AGE: 10/23/1995 20 y.o.  Admit date: 06/24/2015 Discharge date: 06/25/2015  Admission Diagnoses:  Discharge Diagnoses:  Active Problems:   Spondylolisthesis at L5-S1 level   Discharged Condition: good  Hospital Course: Patient admitted the hospital where she underwent an uncomplicated L5-S1 decompression infusion. Postoperative she is doing well. Preoperative back pain improved. No lower extremity pain. Ambulating with minimal difficulty. Ready for discharge home.  Consults:   Significant Diagnostic Studies:   Treatments:   Discharge Exam: Blood pressure 98/56, pulse 74, temperature 98.4 F (36.9 C), temperature source Oral, resp. rate 18, last menstrual period 06/15/2015, SpO2 100 %. Awake and alert. Oriented and appropriate. Motor and sensory function intact. Wound clean and dry. Chest and abdomen benign.  Disposition: Final discharge disposition not confirmed     Medication List    TAKE these medications        diazepam 5 MG tablet  Commonly known as:  VALIUM  Take 1 tablet (5 mg total) by mouth every 6 (six) hours as needed for muscle spasms.     mupirocin ointment 2 %  Commonly known as:  BACTROBAN  Place 1 application into the nose 2 (two) times daily. Needs night dose on 06/24/15 to complete 10 doses     norethindrone-ethinyl estradiol 1-20 MG-MCG tablet  Commonly known as:  MICROGESTIN,JUNEL,LOESTRIN  Take 1 tablet by mouth daily.     oxyCODONE-acetaminophen 5-325 MG tablet  Commonly known as:  PERCOCET/ROXICET  Take 1-2 tablets by mouth every 4 (four) hours as needed for moderate pain.           Follow-up Information    Follow up with Temple PaciniPOOL,Lysha Schrade A, MD.   Specialty:  Neurosurgery   Contact information:   [address] [place] [address] [phone]       Signed: Temple PaciniOOL,Terril Chestnut A 06/25/2015, 10:58 AM

## 2015-07-13 NOTE — Progress Notes (Signed)
Physical Therapy Evaluation Addendum for G-Codes    06/25/15 1216  PT G-Codes **NOT FOR INPATIENT CLASS**  Functional Assessment Tool Used Clinical judgement  Functional Limitation Mobility: Walking and moving around  Mobility: Walking and Moving Around Current Status ([id number](G8978) CI  Mobility: Walking and Moving Around Goal Status [phone](G8979) CI  Mobility: Walking and Moving Around Discharge Status [phone](G8980) CI   Conni SlipperLaura Shigeko Manard, PT, DPT Acute Rehabilitation Services Pager: [phone]

## 2017-01-19 DIAGNOSIS — I7300 Raynaud's syndrome without gangrene: Secondary | ICD-10-CM | POA: Insufficient documentation

## 2017-02-09 IMAGING — RF DG C-ARM 61-120 MIN
1 series · 2 of 2 positions shown · non-contrast
Comparison: 03/27/2015

CLINICAL DATA: Post L5-S1 fusion

EXAM:
LUMBAR SPINE - 2-3 VIEW; DG C-ARM 61-120 MIN

[Series 1: run · 2 of 2 slices shown]
[im 1/2]
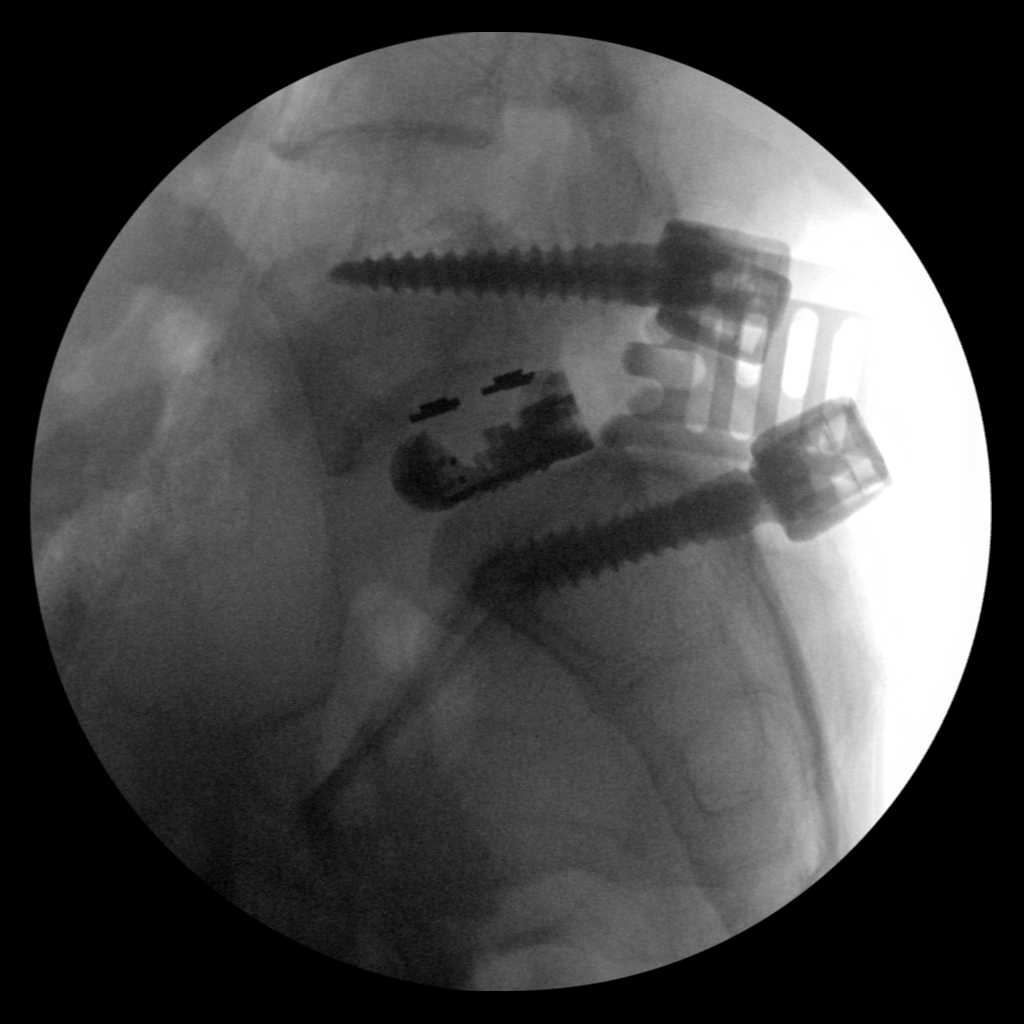
[im 2/2]
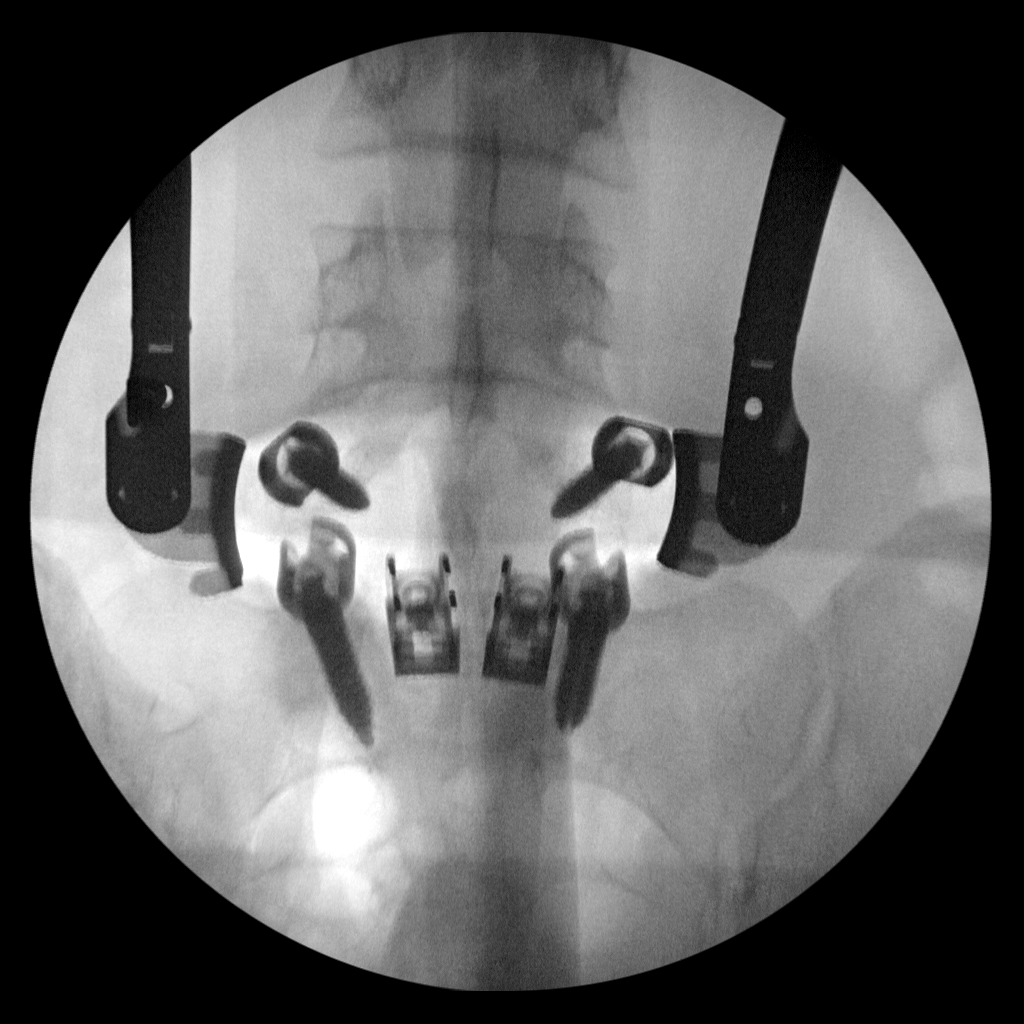

[2 of 2 positions shown; findings below may reference images not displayed]

FINDINGS: Two views of the lumbar spine submitted. Transpedicular screws are
noted at L5 and S1 level. Postsurgical disc spacer noted at L5-S1
level. Again noted mild anterolisthesis L5 on S1 vertebral body.
IMPRESSION: Postsurgical changes L5-S1 level as described above.

Fluoroscopy time was 48 seconds.  Please see the operative report.

## 2017-05-30 ENCOUNTER — Encounter (HOSPITAL_COMMUNITY): Payer: Self-pay | Admitting: *Deleted

## 2017-05-30 ENCOUNTER — Inpatient Hospital Stay (HOSPITAL_COMMUNITY)
Admission: AD | Admit: 2017-05-30 | Discharge: 2017-05-30 | Disposition: A | Discharge status: Home / Self Care | Payer: Medicaid Other | Source: Ambulatory Visit | Attending: Obstetrics & Gynecology | Admitting: Obstetrics & Gynecology

## 2017-05-30 DIAGNOSIS — R109 Unspecified abdominal pain: Secondary | ICD-10-CM | POA: Diagnosis present

## 2017-05-30 DIAGNOSIS — Z3A12 12 weeks gestation of pregnancy: Secondary | ICD-10-CM | POA: Insufficient documentation

## 2017-05-30 DIAGNOSIS — O98812 Other maternal infectious and parasitic diseases complicating pregnancy, second trimester: Secondary | ICD-10-CM

## 2017-05-30 DIAGNOSIS — F319 Bipolar disorder, unspecified: Secondary | ICD-10-CM | POA: Insufficient documentation

## 2017-05-30 DIAGNOSIS — O26891 Other specified pregnancy related conditions, first trimester: Secondary | ICD-10-CM | POA: Diagnosis not present

## 2017-05-30 DIAGNOSIS — Z9889 Other specified postprocedural states: Secondary | ICD-10-CM | POA: Insufficient documentation

## 2017-05-30 DIAGNOSIS — B3731 Acute candidiasis of vulva and vagina: Secondary | ICD-10-CM

## 2017-05-30 DIAGNOSIS — O98819 Other maternal infectious and parasitic diseases complicating pregnancy, unspecified trimester: Secondary | ICD-10-CM

## 2017-05-30 DIAGNOSIS — Z3201 Encounter for pregnancy test, result positive: Secondary | ICD-10-CM | POA: Insufficient documentation

## 2017-05-30 DIAGNOSIS — O98811 Other maternal infectious and parasitic diseases complicating pregnancy, first trimester: Secondary | ICD-10-CM | POA: Insufficient documentation

## 2017-05-30 DIAGNOSIS — F419 Anxiety disorder, unspecified: Secondary | ICD-10-CM | POA: Diagnosis not present

## 2017-05-30 DIAGNOSIS — B9689 Other specified bacterial agents as the cause of diseases classified elsewhere: Secondary | ICD-10-CM | POA: Insufficient documentation

## 2017-05-30 DIAGNOSIS — B373 Candidiasis of vulva and vagina: Secondary | ICD-10-CM

## 2017-05-30 DIAGNOSIS — R102 Pelvic and perineal pain: Secondary | ICD-10-CM | POA: Diagnosis present

## 2017-05-30 DIAGNOSIS — O99341 Other mental disorders complicating pregnancy, first trimester: Secondary | ICD-10-CM | POA: Diagnosis not present

## 2017-05-30 LAB — URINALYSIS, ROUTINE W REFLEX MICROSCOPIC
BACTERIA UA: NONE SEEN
BILIRUBIN URINE: NEGATIVE
Glucose, UA: NEGATIVE mg/dL
HGB URINE DIPSTICK: NEGATIVE
Ketones, ur: 5 mg/dL — AB
NITRITE: NEGATIVE
Protein, ur: NEGATIVE mg/dL
Specific Gravity, Urine: 1.018 (ref 1.005–1.030)
pH: 5 (ref 5.0–8.0)

## 2017-05-30 LAB — WET PREP, GENITAL
SPERM: NONE SEEN
Trich, Wet Prep: NONE SEEN
Yeast Wet Prep HPF POC: NONE SEEN

## 2017-05-30 LAB — POCT PREGNANCY, URINE: PREG TEST UR: POSITIVE — AB

## 2017-05-30 MED ORDER — TERCONAZOLE 0.4 % VA CREA
1.0000 | TOPICAL_CREAM | Freq: Every day | VAGINAL | 0 refills | Status: DC
Start: 2017-05-30 — End: 2023-05-27

## 2017-05-30 NOTE — MAU Note (Signed)
At work earlier [employer] and started having pain in L ovary. Pain got alittle better and then started having pain all over abd and in lower back. Now pain is more in L lower abd. Denies vag bleeding or d/c

## 2017-05-30 NOTE — Progress Notes (Signed)
Caroline Neill CNM in earlier to discuss test results and d/c plan. Written and verbal d/c instructions given and understanding voiced 

## 2017-05-30 NOTE — MAU Provider Note (Signed)
History     CSN: [id number]  Arrival date and time: 05/30/17 0126   First Provider Initiated Contact with Patient 05/30/17 0204     Chief Complaint  Patient presents with  . Abdominal Pain  . Pelvic Pain   HPI Toni Watson is a 22 y.o. G1P0 at 247w1d who presents with lower abdominal and pelvic pain. She states the symptoms started today while she was at work. She describes the pain as cramping that is sometimes sharp in nature. She rates the pain a 5/10 and has not tried anything for the pain. She denies any vaginal bleeding or discharge. [place] in [place]  [place]    Gravida  1   Para      Term      Preterm      AB      Living        SAB      TAB      Ectopic      Multiple      Live Births              Past Medical History:  Diagnosis Date  . Anxiety   . Bipolar disorder (HCC)   . Depression   . Underweight    per patient    Past Surgical History:  Procedure Laterality Date  . BACK SURGERY  06/2015   Has 2 rods, 2 brackets and 4 screws in back  . NO PAST SURGERIES      History reviewed. No pertinent family history.  Social History   Tobacco Use  . Smoking status: Never Smoker  . Smokeless tobacco: Never Used  Substance Use Topics  . Alcohol use: No  . Drug use: No    Allergies: No Known Allergies  Medications Prior to Admission  Medication Sig Dispense Refill Last Dose  . Prenatal Vit-Fe Fumarate-FA (PRENATAL MULTIVITAMIN) TABS tablet Take 1 tablet by mouth daily at 12 noon.   05/29/2017 at Unknown time  . diazepam (VALIUM) 5 MG tablet Take 1 tablet (5 mg total) by mouth every 6 (six) hours as needed for muscle spasms. 30 tablet 0   . mupirocin ointment (BACTROBAN) 2 % Place 1 application into the nose 2 (two) times daily. Needs night dose on 06/24/15 to complete 10 doses     . norethindrone-ethinyl estradiol (MICROGESTIN,JUNEL,LOESTRIN) 1-20 MG-MCG tablet Take 1 tablet by mouth daily.    06/23/2015 at Unknown time   . oxyCODONE-acetaminophen (PERCOCET/ROXICET) 5-325 MG tablet Take 1-2 tablets by mouth every 4 (four) hours as needed for moderate pain. 80 tablet 0     Review of Systems  Constitutional: Negative.  Negative for fatigue and fever.  HENT: Negative.   Respiratory: Negative.  Negative for shortness of breath.   Cardiovascular: Negative.  Negative for chest pain.  Gastrointestinal: Positive for abdominal pain. Negative for constipation, diarrhea, nausea and vomiting.  Genitourinary: Positive for pelvic pain. Negative for dysuria, vaginal bleeding and vaginal discharge.  Neurological: Negative.  Negative for dizziness and headaches.   Physical Exam   Blood pressure 113/66, pulse 87, temperature 98.3 F (36.8 C), resp. rate 18, height 5\' 5"  (1.651 m), weight 110 lb (49.9 kg), last menstrual period 03/13/2017.  Physical Exam  Nursing note and vitals reviewed. Constitutional: She is oriented to person, place, and time. She appears well-developed and well-nourished. No distress.  HENT:  Head: Normocephalic.  Eyes: Pupils are equal, round, and reactive to light.  Cardiovascular: Normal rate, regular rhythm  and normal heart sounds.  Respiratory: Effort normal and breath sounds normal. No respiratory distress.  GI: Soft. Bowel sounds are normal. She exhibits no distension. There is no tenderness.  Genitourinary: Vaginal discharge (thick white adherent discharge) found.  Neurological: She is alert and oriented to person, place, and time.  Skin: Skin is warm and dry.  Psychiatric: She has a normal mood and affect. Her behavior is normal. Judgment and thought content normal.   Dilation: Closed Effacement (%): Thick Exam by:: Cleone Slim CNM  FHT: 161 bpm  MAU Course  Procedures Results for orders placed or performed during the hospital encounter of 05/30/17 (from the past 24 hour(s))  Urinalysis, Routine w reflex microscopic     Status: Abnormal   Collection Time: 05/30/17  1:45 AM   Result Value Ref Range   Color, Urine YELLOW YELLOW   APPearance CLEAR CLEAR   Specific Gravity, Urine 1.018 1.005 - 1.030   pH 5.0 5.0 - 8.0   Glucose, UA NEGATIVE NEGATIVE mg/dL   Hgb urine dipstick NEGATIVE NEGATIVE   Bilirubin Urine NEGATIVE NEGATIVE   Ketones, ur 5 (A) NEGATIVE mg/dL   Protein, ur NEGATIVE NEGATIVE mg/dL   Nitrite NEGATIVE NEGATIVE   Leukocytes, UA SMALL (A) NEGATIVE   RBC / HPF 0-5 0 - 5 RBC/hpf   WBC, UA 0-5 0 - 5 WBC/hpf   Bacteria, UA NONE SEEN NONE SEEN   Squamous Epithelial / LPF 0-5 (A) NONE SEEN   Mucus PRESENT   Pregnancy, urine POC     Status: Abnormal   Collection Time: 05/30/17  1:56 AM  Result Value Ref Range   Preg Test, Ur POSITIVE (A) NEGATIVE  Wet prep, genital     Status: Abnormal   Collection Time: 05/30/17  2:22 AM  Result Value Ref Range   Yeast Wet Prep HPF POC NONE SEEN NONE SEEN   Trich, Wet Prep NONE SEEN NONE SEEN   Clue Cells Wet Prep HPF POC PRESENT (A) NONE SEEN   WBC, Wet Prep HPF POC MANY (A) NONE SEEN   Sperm NONE SEEN    MDM UA Wet prep and gc/chlamydia  Assessment and Plan   1. Candidiasis of vagina during pregnancy   2. Pelvic pain affecting pregnancy in first trimester, antepartum   3. [redacted] weeks gestation of pregnancy    -Discharge home in stable condition -Rx for terazol given to patient -Abdominal pain precautions discussed -Patient advised to follow-up with OB in [facility] as scheduled for prenatal care -Patient may return to MAU as needed or if her condition were to change or worsen  Rolm Bookbinder CNM 05/30/2017, 2:04 AM

## 2017-05-30 NOTE — Discharge Instructions (Signed)

## 2017-05-31 LAB — GC/CHLAMYDIA PROBE AMP (~~LOC~~) NOT AT ARMC
Chlamydia: NEGATIVE
Neisseria Gonorrhea: NEGATIVE

## 2018-02-28 ENCOUNTER — Encounter (HOSPITAL_COMMUNITY): Payer: Self-pay

## 2022-08-11 DIAGNOSIS — R4589 Other symptoms and signs involving emotional state: Secondary | ICD-10-CM | POA: Insufficient documentation

## 2022-08-11 DIAGNOSIS — Z8659 Personal history of other mental and behavioral disorders: Secondary | ICD-10-CM | POA: Insufficient documentation

## 2023-02-11 ENCOUNTER — Other Ambulatory Visit: Payer: Self-pay

## 2023-02-11 ENCOUNTER — Emergency Department (HOSPITAL_COMMUNITY)
Admission: EM | Admit: 2023-02-11 | Discharge: 2023-02-12 | Disposition: A | Discharge status: Home / Self Care | Payer: Commercial Managed Care - PPO | Attending: Emergency Medicine | Admitting: Emergency Medicine

## 2023-02-11 ENCOUNTER — Encounter (HOSPITAL_COMMUNITY): Payer: Self-pay

## 2023-02-11 DIAGNOSIS — R112 Nausea with vomiting, unspecified: Secondary | ICD-10-CM | POA: Insufficient documentation

## 2023-02-11 DIAGNOSIS — R1013 Epigastric pain: Secondary | ICD-10-CM | POA: Insufficient documentation

## 2023-02-11 DIAGNOSIS — R109 Unspecified abdominal pain: Secondary | ICD-10-CM | POA: Diagnosis present

## 2023-02-11 DIAGNOSIS — M545 Low back pain, unspecified: Secondary | ICD-10-CM | POA: Insufficient documentation

## 2023-02-11 DIAGNOSIS — M25511 Pain in right shoulder: Secondary | ICD-10-CM | POA: Diagnosis not present

## 2023-02-11 DIAGNOSIS — M7918 Myalgia, other site: Secondary | ICD-10-CM

## 2023-02-11 LAB — CBC WITH DIFFERENTIAL/PLATELET
Abs Immature Granulocytes: 0.01 10*3/uL (ref 0.00–0.07)
Basophils Absolute: 0.1 10*3/uL (ref 0.0–0.1)
Basophils Relative: 1 %
Eosinophils Absolute: 0.2 10*3/uL (ref 0.0–0.5)
Eosinophils Relative: 3 %
HCT: 37.3 % (ref 36.0–46.0)
Hemoglobin: 12.7 g/dL (ref 12.0–15.0)
Immature Granulocytes: 0 %
Lymphocytes Relative: 30 %
Lymphs Abs: 2.3 10*3/uL (ref 0.7–4.0)
MCH: 29.5 pg (ref 26.0–34.0)
MCHC: 34 g/dL (ref 30.0–36.0)
MCV: 86.7 fL (ref 80.0–100.0)
Monocytes Absolute: 0.6 10*3/uL (ref 0.1–1.0)
Monocytes Relative: 7 %
Neutro Abs: 4.6 10*3/uL (ref 1.7–7.7)
Neutrophils Relative %: 59 %
Platelets: 269 10*3/uL (ref 150–400)
RBC: 4.3 MIL/uL (ref 3.87–5.11)
RDW: 12.1 % (ref 11.5–15.5)
WBC: 7.8 10*3/uL (ref 4.0–10.5)
nRBC: 0 % (ref 0.0–0.2)

## 2023-02-11 LAB — COMPREHENSIVE METABOLIC PANEL
ALT: 16 U/L (ref 0–44)
AST: 16 U/L (ref 15–41)
Albumin: 4.2 g/dL (ref 3.5–5.0)
Alkaline Phosphatase: 46 U/L (ref 38–126)
Anion gap: 6 (ref 5–15)
BUN: 17 mg/dL (ref 6–20)
CO2: 23 mmol/L (ref 22–32)
Calcium: 8.9 mg/dL (ref 8.9–10.3)
Chloride: 109 mmol/L (ref 98–111)
Creatinine, Ser: 0.89 mg/dL (ref 0.44–1.00)
GFR, Estimated: >60 mL/min (ref >60)
Glucose, Bld: 95 mg/dL (ref 70–99)
Potassium: 4.1 mmol/L (ref 3.5–5.1)
Sodium: 138 mmol/L (ref 135–145)
Total Bilirubin: 0.8 mg/dL (ref <1.2)
Total Protein: 7 g/dL (ref 6.5–8.1)

## 2023-02-11 LAB — LIPASE, BLOOD: Lipase: 28 U/L (ref 11–51)

## 2023-02-11 MED ORDER — KETOROLAC TROMETHAMINE 30 MG/ML IJ SOLN
30.0000 mg | Freq: Once | INTRAMUSCULAR | Status: AC
  Administered 2023-02-11: 30 mg via INTRAVENOUS
  Filled 2023-02-11: qty 1

## 2023-02-11 NOTE — ED Triage Notes (Signed)
Pt reports she has had right side neck, shoulder blade and mid back pain x 2 weeks.

## 2023-02-11 NOTE — ED Provider Notes (Signed)
[place] EMERGENCY DEPARTMENT AT [facility] Provider Note   CSN: [id number] Arrival date & time: 02/11/23  2217     History  Chief Complaint  Patient presents with   Neck Pain    Toni Watson is a 27 y.o. female.  HPI     This is a 27 year old female who presents with back pain.  Patient reports she began to have right sided back and flank pain yesterday.  Took ibuprofen and this seemed to help; however pain returned and she began to have sharp right shoulder pain.  She states that she has had some pain here recently over the last 2 weeks.  At times she does feel like it is related to food intake and she can have some abdominal bloating.  She denies any frank abdominal pain.  Denies any injury.  Reports some nausea and vomiting.  No dysuria or hematuria but does report frequency.  Home Medications Prior to Admission medications   Medication Sig Start Date End Date Taking? Authorizing Provider  naproxen (NAPROSYN) 500 MG tablet Take 1 tablet (500 mg total) by mouth 2 (two) times daily. 02/12/23  Yes Morissa Obeirne, Mayer Masker, MD  Prenatal Vit-Fe Fumarate-FA (PRENATAL MULTIVITAMIN) TABS tablet Take 1 tablet by mouth daily at 12 noon.    [provider]  terconazole (TERAZOL 7) 0.4 % vaginal cream Place 1 applicator vaginally at bedtime. 05/30/17   Rolm Bookbinder, CNM      Allergies    Patient has no known allergies.    Review of Systems   Review of Systems  Constitutional:  Negative for fever.  Respiratory:  Negative for shortness of breath.   Cardiovascular:  Negative for chest pain.  Gastrointestinal:  Positive for nausea and vomiting.  Genitourinary:  Positive for flank pain and frequency. Negative for urgency.  All other systems reviewed and are negative.   Physical Exam Updated Vital Signs BP 105/68   Pulse 73   Temp 98.6 F (37 C) (Oral)   Resp 17   Ht 1.676 m (5\' 6" )   Wt 51.3 kg   SpO2 99%   BMI 18.24 kg/m  Physical Exam Vitals and  nursing note reviewed.  Constitutional:      Appearance: She is well-developed. She is not ill-appearing.  HENT:     Head: Normocephalic and atraumatic.  Eyes:     Pupils: Pupils are equal, round, and reactive to light.  Cardiovascular:     Rate and Rhythm: Normal rate and regular rhythm.     Heart sounds: Normal heart sounds.  Pulmonary:     Effort: Pulmonary effort is normal. No respiratory distress.     Breath sounds: No wheezing.  Abdominal:     General: Bowel sounds are normal.     Palpations: Abdomen is soft.     Tenderness: There is abdominal tenderness. There is no guarding or rebound.     Comments: Epigastric tenderness to palpation, no rebound or guarding  Musculoskeletal:     Cervical back: Neck supple.     Comments: Tenderness to palpation right paraspinous muscle region of the upper lumbar spine, tenderness to palpation over the right trapezius  Skin:    General: Skin is warm and dry.  Neurological:     Mental Status: She is alert and oriented to person, place, and time.  Psychiatric:        Mood and Affect: Mood normal.     ED Results / Procedures / Treatments   Labs (all  labs ordered are listed, but only abnormal results are displayed) Labs Reviewed  URINALYSIS, ROUTINE W REFLEX MICROSCOPIC - Abnormal; Notable for the following components:      Result Value   APPearance HAZY (*)    Hgb urine dipstick MODERATE (*)    Leukocytes,Ua MODERATE (*)    All other components within normal limits  CBC WITH DIFFERENTIAL/PLATELET  COMPREHENSIVE METABOLIC PANEL  LIPASE, BLOOD  PREGNANCY, URINE    EKG None  Radiology No results found.  Procedures Procedures    Medications Ordered in ED Medications  ketorolac (TORADOL) 30 MG/ML injection 30 mg (30 mg Intravenous Given 02/11/23 2345)    ED Course/ Medical Decision Making/ A&P                                 Medical Decision Making Amount and/or Complexity of Data Reviewed Labs:  ordered.  Risk Prescription drug management.   This patient presents to the ED for concern of back pain, neck pain, this involves an extensive number of treatment options, and is a complaint that carries with it a high risk of complications and morbidity.  I considered the following differential and admission for this acute, potentially life threatening condition.  The differential diagnosis includes musculoskeletal etiology, spasm, UTI, kidney stone, referred pain from abdominal source such as gallbladder  MDM:    This is a 27 year old female who presents with concerns for back pain.  She is nontoxic and vital signs are reviewed and reassuring.  She does have reproducible pain on exam.  This could suggest musculoskeletal etiology.  However, she has had some postprandial symptoms historically which makes me question whether she may have some referred pain from her gallbladder.  Basic lab work obtained.  CBC, CMP, lipase are all normal.  This would argue against intra-abdominal source or gallbladder disease.  Pregnancy test negative.  Urinalysis is not a clean specimen but does not show any bacteria.  She is not having any ongoing urinary symptoms so have low suspicion for UTI.  Patient overall improved with Toradol.  Would favor musculoskeletal etiology.  Will discharge with naproxen follow-up with primary physician.  (Labs, imaging, consults)  Labs: I Ordered, and personally interpreted labs.  The pertinent results include: CBC, CMP, lipase, urinalysis, urine pregnancy  Imaging Studies ordered: I ordered imaging studies including none I independently visualized and interpreted imaging. I agree with the radiologist interpretation  Additional history obtained from chart review.  External records from outside source obtained and reviewed including prior evaluations  Cardiac Monitoring: The patient was maintained on a cardiac monitor.  If on the cardiac monitor, I personally viewed and  interpreted the cardiac monitored which showed an underlying rhythm of: Sinus  Reevaluation: After the interventions noted above, I reevaluated the patient and found that they have :improved  Social Determinants of Health:  lives independently  Disposition: Discharge  Co morbidities that complicate the patient evaluation  Past Medical History:  Diagnosis Date   Anxiety    Bipolar disorder (HCC)    Depression    Underweight    per patient     Medicines Meds ordered this encounter  Medications   ketorolac (TORADOL) 30 MG/ML injection 30 mg   naproxen (NAPROSYN) 500 MG tablet    Sig: Take 1 tablet (500 mg total) by mouth 2 (two) times daily.    Dispense:  30 tablet    Refill:  0    I  have reviewed the patients home medicines and have made adjustments as needed  Problem List / ED Course: Problem List Items Addressed This Visit   None Visit Diagnoses       Musculoskeletal pain    -  Primary                   Final Clinical Impression(s) / ED Diagnoses Final diagnoses:  Musculoskeletal pain    Rx / DC Orders ED Discharge Orders          Ordered    naproxen (NAPROSYN) 500 MG tablet  2 times daily        02/12/23 0140              Valkyrie Guardiola, Mayer Masker, MD 02/12/23 [phone]

## 2023-02-12 LAB — URINALYSIS, ROUTINE W REFLEX MICROSCOPIC
Bacteria, UA: NONE SEEN
Bilirubin Urine: NEGATIVE
Glucose, UA: NEGATIVE mg/dL
Ketones, ur: NEGATIVE mg/dL
Nitrite: NEGATIVE
Protein, ur: NEGATIVE mg/dL
Specific Gravity, Urine: 1.023 (ref 1.005–1.030)
WBC, UA: >50 WBC/hpf (ref 0–5)
pH: 5 (ref 5.0–8.0)

## 2023-02-12 LAB — PREGNANCY, URINE: Preg Test, Ur: NEGATIVE

## 2023-02-12 MED ORDER — NAPROXEN 500 MG PO TABS: 500.0000 mg | ORAL_TABLET | Freq: Two times a day (BID) | ORAL | 0 refills | Status: DC

## 2023-02-12 NOTE — Discharge Instructions (Signed)
You were seen today for back and neck pain.  Your workup is largely reassuring.  Take medications as prescribed.  If you have any new or worsening symptoms, you should be reevaluated.

## 2023-05-25 NOTE — H&P (View-Only) (Signed)
 Referring Provider:Erskine, Dave Erie, NP  Primary Care Physician:  [facility] Primary Gastroenterologist:  Dr. Mordechai April  Chief Complaint  Patient presents with   Constipation    Having issues with LLQ pain and constipation    HPI:   Toni Watson is a 28 y.o. female presenting today at the request of Juanetta Nordmann, NP for constipation.  Reviewed referral information.  Patient was being seen by Juanetta Nordmann, NP, her OB/GYN provider on 05/10/2023 for ER follow-up of abdominal pain.  She had a CT scan that showed small left ovarian cyst with free pelvic fluid suggesting rupture.  Discussed the potential for hemorrhagic cyst causing severe pain.  A pelvic ultrasound was ordered.  Also noted significant colonic stool burden on CT and suspected constipation may be contributing to her abdominal pain.  Recommended starting MiraLAX daily and consider GI referral if symptoms fail to improve.  Reviewed CT A/P with contrast completed 05/02/2023 at [facility].  She had no acute intra-abdominal abnormalities.  She had small free pelvic fluid with attenuation greater than simple fluid, nonspecific, and may be physiological related to ruptured ovarian cyst.  Moderate colonic stool burden, predominantly right-sided.  Pelvic ultrasound 05/13/2023 with normal uterus and ovaries, satisfactory positioning of IUD.  She followed up with Juanetta Nordmann, NP on 3/27.  She is still having mid lower abdominal pain.  She was taking MiraLAX and has had some bowel movements with this.  Recommended continuing MiraLAX and seeing GI.  Today: States for most of her life, she holds gas in and is not able to go to the bathroom normally.  States she feels like she needs to go, but cannot.  Did not realize she was doing this until it was brought to her attention that her symptoms of lower abdominal pain could be GI related.  She tends to skip a couple days between bowel movements.  When she does have a  bowel movement, it may be hard or loose.  She has associated left lower quadrant abdominal discomfort.  This does get better when her bowels move.  Took MiraLAX for a few days and it eventually did allow her to have a bowel movement after several days.  Has not taken MiraLAX anymore.  Was not sure she should continue this.  She also reports postprandial epigastric abdominal pain for the last few years.  More recently, she has been experiencing nausea and had an episode of vomiting.  Also states that she had a black stool after having her CT in March.  No bright red blood per rectum.  Denies taking Pepto or iron.  Denies any heartburn symptoms.  Sometimes, pills will get stuck if they are too large.  No issues with swallowing foods or liquids.  Denies NSAIDs.  No Fhx of IBD, colon cancer.   Weight has been stable recently.   Past Medical History:  Diagnosis Date   Anxiety    Borderline personality disorder (HCC)    Depression    Underweight    per patient    Past Surgical History:  Procedure Laterality Date   BACK SURGERY  06/2015   Has 2 rods, 2 brackets and 4 screws in back    Current Outpatient Medications  Medication Sig Dispense Refill   buPROPion (WELLBUTRIN XL) 150 MG 24 hr tablet Take 150 mg by mouth daily. TAKES 450 DAILY     buPROPion (WELLBUTRIN XL) 300 MG 24 hr tablet Take 300 mg by mouth daily. TAKES  450 DAILY     levonorgestrel (MIRENA, 52 MG,) 20 MCG/DAY IUD 1 each by Intrauterine route once.     pantoprazole  (PROTONIX ) 40 MG tablet Take 1 tablet (40 mg total) by mouth daily before breakfast. 30 tablet 3   propranolol (INDERAL) 20 MG tablet Take 20 mg by mouth daily as needed.     No current facility-administered medications for this visit.    Allergies as of 05/27/2023   (No Known Allergies)    Family History  Problem Relation Age of Onset   Colon cancer Neg Hx    Inflammatory bowel disease Neg Hx     Social History   Socioeconomic History   Marital  status: Single    Spouse name: Not on file   Number of children: Not on file   Years of education: Not on file   Highest education level: Not on file  Occupational History   Not on file  Tobacco Use   Smoking status: Never   Smokeless tobacco: Never  Vaping Use   Vaping status: Never Used  Substance and Sexual Activity   Alcohol use: No   Drug use: No   Sexual activity: Yes  Other Topics Concern   Not on file  Social History Narrative   Not on file   Social Drivers of Health   Financial Resource Strain: Low Risk  (08/11/2022)   Received from [facility]   Overall Financial Resource Strain (CARDIA)    Difficulty of Paying Living Expenses: Not very hard  Food Insecurity: No Food Insecurity (08/11/2022)   Received from [facility]   Hunger Vital Sign    Worried About Running Out of Food in the Last Year: Never true    Ran Out of Food in the Last Year: Never true  Transportation Needs: No Transportation Needs (08/11/2022)   Received from [facility]   PRAPARE - Transportation    Lack of Transportation (Medical): No    Lack of Transportation (Non-Medical): No  Physical Activity: Not on file  Stress: Not on file  Social Connections: Not on file  Intimate Partner Violence: Not At Risk (08/11/2022)   Received from [facility]   Humiliation, Afraid, Rape, and Kick questionnaire    Fear of Current or Ex-Partner: No    Emotionally Abused: No    Physically Abused: No    Sexually Abused: No    Review of Systems: Gen: Denies any fever, chills, or flulike symptoms, presyncope, syncope. CV: Denies chest pain.  Admits to occasional heart palpitations. Resp: Denies shortness of breath, cough. GI: See HPI GU : Denies urinary burning, urinary frequency, urinary hesitancy MS: Denies joint pain. Derm: Denies rash. Psych: Denies depression, anxiety. Heme: See HPI  Physical Exam: BP 116/83 (BP Location: Right Arm, Patient Position: Sitting, Cuff Size: Normal)   Pulse  80   Temp 98.2 F (36.8 C) (Oral)   Ht 5\' 6"  (1.676 m)   Wt 117 lb 9.6 oz (53.3 kg)   LMP  (LMP Unknown)   SpO2 99%   Breastfeeding No   BMI 18.98 kg/m  General:   Alert and oriented. Pleasant and cooperative. Well-nourished and well-developed.  Head:  Normocephalic and atraumatic. Eyes:  Without icterus, sclera clear and conjunctiva pink.  Ears:  Normal auditory acuity. Lungs:  Clear to auscultation bilaterally. No wheezes, rales, or rhonchi. No distress.  Heart:  S1, S2 present without murmurs appreciated.  Abdomen:  +BS, soft, and non-distended. Mild TTP in epigastric, LUQ, and  LLQ region. No HSM noted. No guarding or rebound. No masses appreciated.  Rectal:  Deferred  Msk:  Symmetrical without gross deformities. Normal posture. Extremities:  Without edema. Neurologic:  Alert and  oriented x4;  grossly normal neurologically. Skin:  Intact without significant lesions or rashes. Psych:  Normal mood and affect.    Assessment:  28 year old female with history of anxiety, depression, borderline personality disorder, presenting today for further evaluation of abdominal pain and constipation.  Epigastric abdominal pain: Present for the last couple years.  Worsens postprandially but also has some improvement with a bowel movement.  Associated nausea more recently, single episode of vomiting.  Also reports episode of black stool after CT scan in March, but no recurrent symptoms.  No NSAIDs. No acute findings on CT to explain upper abdominal pain. Gallbladder/biliary system appeared normal.  RUQ ultrasound in 2021 without evidence of gallstones.  Differentials include pain related to uncontrolled constipation/possible IBS, gastritis, duodenitis, PUD, H. Pylori, and biliary dyskinesia. Recommended starting daily PPI and proceeding with EGD for further evaluation.  Dysphagia: Only occurring with large pills.  This can be evaluated at the time of planned EGD.  Will add possible dilation as  appropriate.  Constipation/LLQ abdominal pain: Chronic.  Likely has component of IBS.  No alarm symptoms.  Recent CT in March without evidence of bowel inflammation, presence of moderate colonic stool burden.  No family history of colon cancer or IBD.  Will try her on Linzess for suspected IBS.  If left lower quadrant abdominal pain does not improve with management of constipation, may need to consider colonoscopy.   Plan:  Proceed with upper endoscopy +/- dilation with propofol  by Dr. Mordechai April in near future. The risks, benefits, and alternatives have been discussed with the patient in detail. The patient states understanding and desires to proceed.  ASA 2 UPT Start pantoprazole  40 mg daily 30 minutes before breakfast. Start Linzess 145 mcg daily 30 minutes before breakfast.  Samples provided.  Requested progress report in 1 week. Follow-up after EGD.   [name], PA-C [facility] 05/27/2023

## 2023-05-25 NOTE — Progress Notes (Unsigned)
 Referring Provider:Erskine, Wilhemina Cash, NP  Primary Care Physician:  [facility] Primary Gastroenterologist:  Dr. Marletta Lor  Chief Complaint  Patient presents with   Constipation    Having issues with LLQ pain and constipation    HPI:   Toni Watson is a 28 y.o. female presenting today at the request of Orbie Hurst, NP for constipation.  Reviewed referral information.  Patient was being seen by Orbie Hurst, NP, her OB/GYN provider on 05/10/2023 for ER follow-up of abdominal pain.  She had a CT scan that showed small left ovarian cyst with free pelvic fluid suggesting rupture.  Discussed the potential for hemorrhagic cyst causing severe pain.  A pelvic ultrasound was ordered.  Also noted significant colonic stool burden on CT and suspected constipation may be contributing to her abdominal pain.  Recommended starting MiraLAX daily and consider GI referral if symptoms fail to improve.  Reviewed CT A/P with contrast completed 05/02/2023 at [facility].  She had no acute intra-abdominal abnormalities.  She had small free pelvic fluid with attenuation greater than simple fluid, nonspecific, and may be physiological related to ruptured ovarian cyst.  Moderate colonic stool burden, predominantly right-sided.  Pelvic ultrasound 05/13/2023 with normal uterus and ovaries, satisfactory positioning of IUD.  She followed up with Orbie Hurst, NP on 3/27.  She is still having mid lower abdominal pain.  She was taking MiraLAX and has had some bowel movements with this.  Recommended continuing MiraLAX and seeing GI.  Today: States for most of her life, she holds gas in and is not able to go to the bathroom normally.  States she feels like she needs to go, but cannot.  Did not realize she was doing this until it was brought to her attention that her symptoms of lower abdominal pain could be GI related.  She tends to skip a couple days between bowel movements.  When she does have a  bowel movement, it may be hard or loose.  She has associated left lower quadrant abdominal discomfort.  This does get better when her bowels move.  Took MiraLAX for a few days and it eventually did allow her to have a bowel movement after several days.  Has not taken MiraLAX anymore.  Was not sure she should continue this.  She also reports postprandial epigastric abdominal pain for the last few years.  More recently, she has been experiencing nausea and had an episode of vomiting.  Also states that she had a black stool after having her CT in March.  No bright red blood per rectum.  Denies taking Pepto or iron.  Denies any heartburn symptoms.  Sometimes, pills will get stuck if they are too large.  No issues with swallowing foods or liquids.  Denies NSAIDs.  No Fhx of IBD, colon cancer.   Weight has been stable recently.   Past Medical History:  Diagnosis Date   Anxiety    Borderline personality disorder (HCC)    Depression    Underweight    per patient    Past Surgical History:  Procedure Laterality Date   BACK SURGERY  06/2015   Has 2 rods, 2 brackets and 4 screws in back    Current Outpatient Medications  Medication Sig Dispense Refill   buPROPion (WELLBUTRIN XL) 150 MG 24 hr tablet Take 150 mg by mouth daily. TAKES 450 DAILY     buPROPion (WELLBUTRIN XL) 300 MG 24 hr tablet Take 300 mg by mouth daily. TAKES  450 DAILY     levonorgestrel (MIRENA, 52 MG,) 20 MCG/DAY IUD 1 each by Intrauterine route once.     pantoprazole (PROTONIX) 40 MG tablet Take 1 tablet (40 mg total) by mouth daily before breakfast. 30 tablet 3   propranolol (INDERAL) 20 MG tablet Take 20 mg by mouth daily as needed.     No current facility-administered medications for this visit.    Allergies as of 05/27/2023   (No Known Allergies)    Family History  Problem Relation Age of Onset   Colon cancer Neg Hx    Inflammatory bowel disease Neg Hx     Social History   Socioeconomic History   Marital  status: Single    Spouse name: Not on file   Number of children: Not on file   Years of education: Not on file   Highest education level: Not on file  Occupational History   Not on file  Tobacco Use   Smoking status: Never   Smokeless tobacco: Never  Vaping Use   Vaping status: Never Used  Substance and Sexual Activity   Alcohol use: No   Drug use: No   Sexual activity: Yes  Other Topics Concern   Not on file  Social History Narrative   Not on file   Social Drivers of Health   Financial Resource Strain: Low Risk  (08/11/2022)   Received from [facility]   Overall Financial Resource Strain (CARDIA)    Difficulty of Paying Living Expenses: Not very hard  Food Insecurity: No Food Insecurity (08/11/2022)   Received from [facility]   Hunger Vital Sign    Worried About Running Out of Food in the Last Year: Never true    Ran Out of Food in the Last Year: Never true  Transportation Needs: No Transportation Needs (08/11/2022)   Received from [facility]   PRAPARE - Transportation    Lack of Transportation (Medical): No    Lack of Transportation (Non-Medical): No  Physical Activity: Not on file  Stress: Not on file  Social Connections: Not on file  Intimate Partner Violence: Not At Risk (08/11/2022)   Received from [facility]   Humiliation, Afraid, Rape, and Kick questionnaire    Fear of Current or Ex-Partner: No    Emotionally Abused: No    Physically Abused: No    Sexually Abused: No    Review of Systems: Gen: Denies any fever, chills, or flulike symptoms, presyncope, syncope. CV: Denies chest pain.  Admits to occasional heart palpitations. Resp: Denies shortness of breath, cough. GI: See HPI GU : Denies urinary burning, urinary frequency, urinary hesitancy MS: Denies joint pain. Derm: Denies rash. Psych: Denies depression, anxiety. Heme: See HPI  Physical Exam: BP 116/83 (BP Location: Right Arm, Patient Position: Sitting, Cuff Size: Normal)   Pulse  80   Temp 98.2 F (36.8 C) (Oral)   Ht 5\' 6"  (1.676 m)   Wt 117 lb 9.6 oz (53.3 kg)   LMP  (LMP Unknown)   SpO2 99%   Breastfeeding No   BMI 18.98 kg/m  General:   Alert and oriented. Pleasant and cooperative. Well-nourished and well-developed.  Head:  Normocephalic and atraumatic. Eyes:  Without icterus, sclera clear and conjunctiva pink.  Ears:  Normal auditory acuity. Lungs:  Clear to auscultation bilaterally. No wheezes, rales, or rhonchi. No distress.  Heart:  S1, S2 present without murmurs appreciated.  Abdomen:  +BS, soft, and non-distended. Mild TTP in epigastric, LUQ, and  LLQ region. No HSM noted. No guarding or rebound. No masses appreciated.  Rectal:  Deferred  Msk:  Symmetrical without gross deformities. Normal posture. Extremities:  Without edema. Neurologic:  Alert and  oriented x4;  grossly normal neurologically. Skin:  Intact without significant lesions or rashes. Psych:  Normal mood and affect.    Assessment:  28 year old female with history of anxiety, depression, borderline personality disorder, presenting today for further evaluation of abdominal pain and constipation.  Epigastric abdominal pain: Present for the last couple years.  Worsens postprandially but also has some improvement with a bowel movement.  Associated nausea more recently, single episode of vomiting.  Also reports episode of black stool after CT scan in March, but no recurrent symptoms.  No NSAIDs. No acute findings on CT to explain upper abdominal pain. Gallbladder/biliary system appeared normal.  RUQ ultrasound in 2021 without evidence of gallstones.  Differentials include pain related to uncontrolled constipation/possible IBS, gastritis, duodenitis, PUD, H. Pylori, and biliary dyskinesia. Recommended starting daily PPI and proceeding with EGD for further evaluation.  Dysphagia: Only occurring with large pills.  This can be evaluated at the time of planned EGD.  Will add possible dilation as  appropriate.  Constipation/LLQ abdominal pain: Chronic.  Likely has component of IBS.  No alarm symptoms.  Recent CT in March without evidence of bowel inflammation, presence of moderate colonic stool burden.  No family history of colon cancer or IBD.  Will try her on Linzess for suspected IBS.  If left lower quadrant abdominal pain does not improve with management of constipation, may need to consider colonoscopy.   Plan:  Proceed with upper endoscopy +/- dilation with propofol by Dr. Marletta Lor in near future. The risks, benefits, and alternatives have been discussed with the patient in detail. The patient states understanding and desires to proceed.  ASA 2 UPT Start pantoprazole 40 mg daily 30 minutes before breakfast. Start Linzess 145 mcg daily 30 minutes before breakfast.  Samples provided.  Requested progress report in 1 week. Follow-up after EGD.   Ermalinda Memos, PA-C [facility] 05/27/2023

## 2023-05-27 ENCOUNTER — Encounter: Payer: Self-pay | Admitting: Gastroenterology

## 2023-05-27 ENCOUNTER — Encounter: Payer: Self-pay | Admitting: *Deleted

## 2023-05-27 ENCOUNTER — Ambulatory Visit: Admitting: Gastroenterology

## 2023-05-27 VITALS — BP 116/83 | HR 80 | Temp 98.2°F | Ht 66.0 in | Wt 117.6 lb

## 2023-05-27 DIAGNOSIS — R1013 Epigastric pain: Secondary | ICD-10-CM

## 2023-05-27 DIAGNOSIS — R1032 Left lower quadrant pain: Secondary | ICD-10-CM | POA: Diagnosis not present

## 2023-05-27 DIAGNOSIS — K921 Melena: Secondary | ICD-10-CM

## 2023-05-27 DIAGNOSIS — R1310 Dysphagia, unspecified: Secondary | ICD-10-CM

## 2023-05-27 DIAGNOSIS — R110 Nausea: Secondary | ICD-10-CM

## 2023-05-27 DIAGNOSIS — K5909 Other constipation: Secondary | ICD-10-CM | POA: Diagnosis not present

## 2023-05-27 DIAGNOSIS — K5900 Constipation, unspecified: Secondary | ICD-10-CM

## 2023-05-27 MED ORDER — PANTOPRAZOLE SODIUM 40 MG PO TBEC
40.0000 mg | DELAYED_RELEASE_TABLET | Freq: Every day | ORAL | 3 refills | Status: DC
Start: 2023-05-27 — End: 2023-06-17

## 2023-05-27 NOTE — Patient Instructions (Signed)
 We will get you scheduled for an upper endoscopy with Dr. Marletta Lor at [facility].  I would like for you to start pantoprazole 40 mg daily 30 minutes before breakfast to help with your upper abdominal pain as you may have inflammation in your stomach.  This will be further evaluated at the time of your upper endoscopy.  I would also like for you to start Linzess 145 mcg daily, 30 minutes before breakfast constipation and possibly IBS.  We are providing you with samples.  Please call or send a MyChart message next week to let me know how this works for you.  As we discussed, Linzess often causes diarrhea for the first 1-2 weeks but usually tapers off thereafter.  I will plan to see you back in the office after your procedure.  Do not hesitate to call sooner if you have questions or concerns.  It was nice to meet you today!  Ermalinda Memos, PA-C [facility]

## 2023-06-01 NOTE — Telephone Encounter (Signed)
 PA submitted via [employer] Case ID# [id number]

## 2023-06-02 NOTE — Telephone Encounter (Signed)
 PA approved. Auth#:  [id number] DOS: 06/15/2023-09/12/2023

## 2023-06-15 ENCOUNTER — Encounter (HOSPITAL_COMMUNITY)
Admission: RE | Disposition: A | Discharge status: Home / Self Care | Payer: Self-pay | Source: Home / Self Care | Attending: Internal Medicine

## 2023-06-15 ENCOUNTER — Ambulatory Visit (HOSPITAL_COMMUNITY): Admitting: Anesthesiology

## 2023-06-15 ENCOUNTER — Encounter (HOSPITAL_COMMUNITY): Payer: Self-pay | Admitting: Internal Medicine

## 2023-06-15 ENCOUNTER — Other Ambulatory Visit: Payer: Self-pay

## 2023-06-15 ENCOUNTER — Ambulatory Visit (HOSPITAL_COMMUNITY)
Admission: RE | Admit: 2023-06-15 | Discharge: 2023-06-15 | Disposition: A | Discharge status: Home / Self Care | Attending: Internal Medicine | Admitting: Internal Medicine

## 2023-06-15 DIAGNOSIS — F32A Depression, unspecified: Secondary | ICD-10-CM | POA: Diagnosis not present

## 2023-06-15 DIAGNOSIS — Z79899 Other long term (current) drug therapy: Secondary | ICD-10-CM | POA: Insufficient documentation

## 2023-06-15 DIAGNOSIS — R1310 Dysphagia, unspecified: Secondary | ICD-10-CM

## 2023-06-15 DIAGNOSIS — K219 Gastro-esophageal reflux disease without esophagitis: Secondary | ICD-10-CM | POA: Insufficient documentation

## 2023-06-15 DIAGNOSIS — R195 Other fecal abnormalities: Secondary | ICD-10-CM | POA: Diagnosis not present

## 2023-06-15 DIAGNOSIS — F419 Anxiety disorder, unspecified: Secondary | ICD-10-CM | POA: Diagnosis not present

## 2023-06-15 DIAGNOSIS — K2970 Gastritis, unspecified, without bleeding: Secondary | ICD-10-CM | POA: Diagnosis not present

## 2023-06-15 DIAGNOSIS — R1013 Epigastric pain: Secondary | ICD-10-CM | POA: Insufficient documentation

## 2023-06-15 DIAGNOSIS — R1032 Left lower quadrant pain: Secondary | ICD-10-CM | POA: Diagnosis not present

## 2023-06-15 HISTORY — PX: ESOPHAGEAL DILATION: SHX303

## 2023-06-15 HISTORY — DX: Gastro-esophageal reflux disease without esophagitis: K21.9

## 2023-06-15 HISTORY — PX: ESOPHAGOGASTRODUODENOSCOPY: SHX5428

## 2023-06-15 SURGERY — EGD (ESOPHAGOGASTRODUODENOSCOPY)
Anesthesia: General

## 2023-06-15 MED ORDER — MIDAZOLAM HCL 2 MG/2ML IJ SOLN
INTRAMUSCULAR | Status: AC
  Filled 2023-06-15: qty 2

## 2023-06-15 MED ORDER — LIDOCAINE 2% (20 MG/ML) 5 ML SYRINGE
INTRAMUSCULAR | Status: AC
  Filled 2023-06-15: qty 5

## 2023-06-15 MED ORDER — MIDAZOLAM HCL 2 MG/2ML IJ SOLN
INTRAMUSCULAR | Status: DC | PRN
  Administered 2023-06-15: 2 mg via INTRAVENOUS

## 2023-06-15 MED ORDER — PROPOFOL 500 MG/50ML IV EMUL
INTRAVENOUS | Status: DC | PRN
Start: 2023-06-15 — End: 2023-06-15
  Administered 2023-06-15: 150 ug/kg/min via INTRAVENOUS

## 2023-06-15 MED ORDER — LIDOCAINE 2% (20 MG/ML) 5 ML SYRINGE
INTRAMUSCULAR | Status: DC | PRN
  Administered 2023-06-15: 20 mg via INTRAVENOUS
  Administered 2023-06-15: 80 mg via INTRAVENOUS

## 2023-06-15 MED ORDER — LACTATED RINGERS IV SOLN: INTRAVENOUS | Status: DC | PRN

## 2023-06-15 MED ORDER — PROPOFOL 10 MG/ML IV BOLUS
INTRAVENOUS | Status: DC | PRN
Start: 2023-06-15 — End: 2023-06-15
  Administered 2023-06-15: 70 mg via INTRAVENOUS
  Administered 2023-06-15 (×2): 30 mg via INTRAVENOUS

## 2023-06-15 NOTE — Anesthesia Preprocedure Evaluation (Signed)
 Anesthesia Evaluation  Patient identified by MRN, date of birth, ID band Patient awake    Reviewed: Allergy & Precautions, H&P , NPO status , Patient's Chart, lab work & pertinent test results, reviewed documented beta blocker date and time   Airway Mallampati: II  TM Distance: >3 FB Neck ROM: full    Dental no notable dental hx. (+) Dental Advisory Given, Teeth Intact   Pulmonary neg pulmonary ROS   Pulmonary exam normal breath sounds clear to auscultation       Cardiovascular Exercise Tolerance: Good negative cardio ROS Normal cardiovascular exam Rhythm:regular Rate:Normal     Neuro/Psych  PSYCHIATRIC DISORDERS Anxiety Depression    Borderline personalitynegative neurological ROS     GI/Hepatic Neg liver ROS,GERD  ,,  Endo/Other  negative endocrine ROS    Renal/GU negative Renal ROS  negative genitourinary   Musculoskeletal   Abdominal   Peds  Hematology negative hematology ROS (+)   Anesthesia Other Findings   Reproductive/Obstetrics negative OB ROS                             Anesthesia Physical Anesthesia Plan  ASA: 2  Anesthesia Plan: General   Post-op Pain Management: Minimal or no pain anticipated   Induction: Intravenous  PONV Risk Score and Plan: Propofol  infusion  Airway Management Planned: Nasal Cannula and Natural Airway  Additional Equipment: None  Intra-op Plan:   Post-operative Plan:   Informed Consent: I have reviewed the patients History and Physical, chart, labs and discussed the procedure including the risks, benefits and alternatives for the proposed anesthesia with the patient or authorized representative who has indicated his/her understanding and acceptance.     Dental Advisory Given  Plan Discussed with: CRNA  Anesthesia Plan Comments:        Anesthesia Quick Evaluation

## 2023-06-15 NOTE — Transfer of Care (Addendum)
 Immediate Anesthesia Transfer of Care Note  Patient: Toni Watson  Procedure(s) Performed: EGD (ESOPHAGOGASTRODUODENOSCOPY) DILATION, ESOPHAGUS  Patient Location: Endoscopy Unit  Anesthesia Type:General  Level of Consciousness: drowsy and patient cooperative  Airway & Oxygen Therapy: Patient Spontanous Breathing and Patient connected to nasal cannula oxygen  Post-op Assessment: Report given to RN and Post -op Vital signs reviewed and stable  Post vital signs: Reviewed and stable  Last Vitals:  Vitals Value Taken Time  BP 92/66 06/15/23   1003  Temp 36.4 06/15/23   0948  Pulse 76 06/15/23   0948  Resp 18 06/15/23   0948  SpO2 99% 06/15/23   0948    Last Pain:  Vitals:   06/15/23 0935  TempSrc:   PainSc: 4       Patients Stated Pain Goal: 8 (06/15/23 0824)  Complications: No notable events documented.

## 2023-06-15 NOTE — Anesthesia Postprocedure Evaluation (Signed)
 Anesthesia Post Note  Patient: Toni Watson  Procedure(s) Performed: EGD (ESOPHAGOGASTRODUODENOSCOPY) DILATION, ESOPHAGUS  Patient location during evaluation: PACU Anesthesia Type: General Level of consciousness: awake and alert Pain management: pain level controlled Vital Signs Assessment: post-procedure vital signs reviewed and stable Respiratory status: spontaneous breathing, nonlabored ventilation, respiratory function stable and patient connected to nasal cannula oxygen Cardiovascular status: blood pressure returned to baseline and stable Postop Assessment: no apparent nausea or vomiting Anesthetic complications: no   There were no known notable events for this encounter.   Last Vitals:  Vitals:   06/15/23 0948 06/15/23 1003  BP: (!) 82/44 92/66  Pulse: 75   Resp: 18   Temp: 36.4 C   SpO2: 99% 100%    Last Pain:  Vitals:   06/15/23 1003  TempSrc:   PainSc: 0-No pain                 Shanyla Marconi L Takiya Belmares

## 2023-06-15 NOTE — Op Note (Signed)
 [facility] Patient Name: Toni Watson Procedure Date: 06/15/2023 9:12 AM MRN: [id number] Date of Birth: 1995-08-27 Attending MD: Rolando Cliche. Mordechai April , [place], [id number] CSN: [id number] Age: 28 Admit Type: Outpatient Procedure:                Upper GI endoscopy Indications:              Epigastric abdominal pain, Dysphagia Providers:                Rolando Cliche. Mordechai April, DO, Emilee Tubb RN, RN, [place]                            Wilson, Technician, Theola Fitch Referring MD:              Medicines:                See the Anesthesia note for documentation of the                            administered medications Complications:            No immediate complications. Estimated Blood Loss:     Estimated blood loss was minimal. Procedure:                Pre-Anesthesia Assessment:                           - The anesthesia plan was to use monitored                            anesthesia care (MAC).                           After obtaining informed consent, the endoscope was                            passed under direct vision. Throughout the                            procedure, the patient's blood pressure, pulse, and                            oxygen saturations were monitored continuously. The                            GIF-H190 ([id number]) scope was introduced through the                            mouth, and advanced to the second part of duodenum.                            The upper GI endoscopy was accomplished without                            difficulty. The patient tolerated the procedure  well. Scope In: 9:38:15 AM Scope Out: 9:42:02 AM Total Procedure Duration: 0 hours 3 minutes 47 seconds  Findings:      The Z-line was regular and was found 38 cm from the incisors.      Biopsies were taken with a cold forceps in the middle third of the       esophagus for histology.      Minimal inflammation characterized by erythema was found in the gastric        body. Biopsies were taken with a cold forceps for Helicobacter pylori       testing.      The duodenal bulb, first portion of the duodenum and second portion of       the duodenum were normal.      There is no endoscopic evidence of stenosis or stricture in the entire       esophagus. Impression:               - Z-line regular, 38 cm from the incisors.                           - Gastritis. Biopsied.                           - Normal duodenal bulb, first portion of the                            duodenum and second portion of the duodenum.                           - Biopsies were taken with a cold forceps for                            histology in the middle third of the esophagus. Moderate Sedation:      Per Anesthesia Care Recommendation:           - Patient has a contact number available for                            emergencies. The signs and symptoms of potential                            delayed complications were discussed with the                            patient. Return to normal activities tomorrow.                            Written discharge instructions were provided to the                            patient.                           - Resume previous diet.                           - Continue present medications.                           -  Await pathology results.                           - Use a proton pump inhibitor PO daily.                           - Consider updating RUQ US  and/or HIDA to evaluate                            for biliary colic if no improvement on PPI                           - Return to GI clinic in 6 weeks. Procedure Code(s):        --- Professional ---                           [phone], Esophagogastroduodenoscopy, flexible,                            transoral; with biopsy, single or multiple Diagnosis Code(s):        --- Professional ---                           K29.70, Gastritis, unspecified, without bleeding                            R10.13, Epigastric pain                           R13.10, Dysphagia, unspecified CPT copyright 2022 American Medical Association. All rights reserved. The codes documented in this report are preliminary and upon coder review may  be revised to meet current compliance requirements. Rolando Cliche. Mordechai April, DO Rolando Cliche. Mordechai April, DO 06/15/2023 9:47:15 AM This report has been signed electronically. Number of Addenda: 0

## 2023-06-15 NOTE — Discharge Instructions (Addendum)
 EGD Discharge instructions Please read the instructions outlined below and refer to this sheet in the next few weeks. These discharge instructions provide you with general information on caring for yourself after you leave the hospital. Your doctor may also give you specific instructions. While your treatment has been planned according to the most current medical practices available, unavoidable complications occasionally occur. If you have any problems or questions after discharge, please call your doctor. ACTIVITY You may resume your regular activity but move at a slower pace for the next 24 hours.  Take frequent rest periods for the next 24 hours.  Walking will help expel (get rid of) the air and reduce the bloated feeling in your abdomen.  No driving for 24 hours (because of the anesthesia (medicine) used during the test).  You may shower.  Do not sign any important legal documents or operate any machinery for 24 hours (because of the anesthesia used during the test).  NUTRITION Drink plenty of fluids.  You may resume your normal diet.  Begin with a light meal and progress to your normal diet.  Avoid alcoholic beverages for 24 hours or as instructed by your caregiver.  MEDICATIONS You may resume your normal medications unless your caregiver tells you otherwise.  WHAT YOU CAN EXPECT TODAY You may experience abdominal discomfort such as a feeling of fullness or "gas" pains.  FOLLOW-UP Your doctor will discuss the results of your test with you.  SEEK IMMEDIATE MEDICAL ATTENTION IF ANY OF THE FOLLOWING OCCUR: Excessive nausea (feeling sick to your stomach) and/or vomiting.  Severe abdominal pain and distention (swelling).  Trouble swallowing.  Temperature over 101 F (37.8 C).  Rectal bleeding or vomiting of blood.   Your EGD revealed mild amount inflammation in your stomach.  I took biopsies of this to rule out infection with a bacteria called H. pylori.  I also took samples of your  esophagus.  Await pathology results, my office will contact you.  Your esophagus was wide open, I do not need to stretch it today.  Small bowel appeared normal.  Continue on pantoprazole  daily.  If symptoms do not improve over the next 4 to 6 weeks, we may need to consider evaluating your gallbladder with updated ultrasound and/or HIDA scan.  We can discuss further on follow-up.  I hope you have a great rest of your week!  Rolando Cliche. Mordechai April, D.O. Gastroenterology and Hepatology [facility]

## 2023-06-15 NOTE — Interval H&P Note (Signed)
 History and Physical Interval Note:  06/15/2023 9:26 AM  Toni Watson  has presented today for surgery, with the diagnosis of DYSPHAGIA, EPIGASTRIC PAIN, LLQ PAIN, BLACK STOOL.  The various methods of treatment have been discussed with the patient and family. After consideration of risks, benefits and other options for treatment, the patient has consented to  Procedure(s) with comments: EGD (ESOPHAGOGASTRODUODENOSCOPY) (N/A) - 10:00am, asa 2 DILATION, ESOPHAGUS (N/A) as a surgical intervention.  The patient's history has been reviewed, patient examined, no change in status, stable for surgery.  I have reviewed the patient's chart and labs.  Questions were answered to the patient's satisfaction.     [name]

## 2023-06-16 ENCOUNTER — Encounter (HOSPITAL_COMMUNITY): Payer: Self-pay | Admitting: Internal Medicine

## 2023-06-16 LAB — SURGICAL PATHOLOGY

## 2023-06-17 ENCOUNTER — Other Ambulatory Visit: Payer: Self-pay | Admitting: Gastroenterology

## 2023-06-17 DIAGNOSIS — R1013 Epigastric pain: Secondary | ICD-10-CM

## 2023-06-17 MED ORDER — PANTOPRAZOLE SODIUM 40 MG PO TBEC
40.0000 mg | DELAYED_RELEASE_TABLET | Freq: Every day | ORAL | 3 refills | Status: DC
Start: 2023-06-17 — End: 2023-11-22

## 2023-06-17 NOTE — Telephone Encounter (Signed)
 Noted.

## 2023-06-17 NOTE — Telephone Encounter (Signed)
 Toni Watson, can we get Linzess 72 mcg samples ready for the patient? We can give 2 boxes.  I will let her know she can come by to pick them up.

## 2023-07-21 NOTE — Progress Notes (Deleted)
 Referring Provider: [facility] Primary Care Physician:  [facility] Primary GI Physician: Dr. Aaron Aas  No chief complaint on file.   HPI:   Toni Watson is a 28 y.o. female presenting today with a history of    Seen in the office 05/27/2023 for initial consult of constipation, LLQ abdominal pain, epigastric pain, dysphagia, and black stool.  Reported hard or loose stools every couple of days.  Associated LLQ abdominal discomfort that improved with bowel movement.  Took MiraLAX for a short period until she had a bowel movement, then stopped.  Also with few years of postprandial epigastric pain and more recently with nausea and an episode of vomiting.  Also reported black stool after CT in March.  Denied Pepto or iron consumption.  Also noted occasional dysphagia to large pills.  Recommended EGD with possible dilation, start pantoprazole  daily, Linzess 145 mcg daily (samples provided).  Patient requested to try lower dose of Linzess.  Samples of 72 mcg were left for her.  EGD 06/15/2023: Normal esophagus biopsied, minimal gastritis biopsied.  Recommended considering updating RUQ ultrasound and/or HIDA to evaluate for biliary colic if no improvement in epigastric pain on PPI.  Pathology with no significant changes, no H. pylori.    Today: Constipation/LLQ abdominal pain:   Epigastric pain/nausea/vomiting:    Dysphagia:       CT A/P with contrast completed 05/02/2023 at [facility].  She had no acute intra-abdominal abnormalities.  She had small free pelvic fluid with attenuation greater than simple fluid, nonspecific, and may be physiological related to ruptured ovarian cyst.  Moderate colonic stool burden, predominantly right-sided.   Pelvic ultrasound 05/13/2023 with normal uterus and ovaries, satisfactory positioning of IUD.  Past Medical History:  Diagnosis Date   Anxiety    Borderline personality disorder (HCC)    Depression    GERD  (gastroesophageal reflux disease)    Underweight    per patient    Past Surgical History:  Procedure Laterality Date   BACK SURGERY  06/2015   Has 2 rods, 2 brackets and 4 screws in back   ESOPHAGEAL DILATION N/A 06/15/2023   Procedure: DILATION, ESOPHAGUS;  Surgeon: [name], DO;  [facility];  Service: Endoscopy;  Laterality: N/A;   ESOPHAGOGASTRODUODENOSCOPY N/A 06/15/2023   Procedure: EGD (ESOPHAGOGASTRODUODENOSCOPY);  Surgeon: [name], DO;  [facility];  Service: Endoscopy;  Laterality: N/A;  10:00am, asa 2    Current Outpatient Medications  Medication Sig Dispense Refill   buPROPion (WELLBUTRIN XL) 150 MG 24 hr tablet Take 150 mg by mouth daily. TAKES 450 DAILY     buPROPion (WELLBUTRIN XL) 300 MG 24 hr tablet Take 300 mg by mouth daily. TAKES 450 DAILY     levonorgestrel (MIRENA, 52 MG,) 20 MCG/DAY IUD 1 each by Intrauterine route once.     pantoprazole  (PROTONIX ) 40 MG tablet Take 1 tablet (40 mg total) by mouth daily before breakfast. 30 tablet 3   propranolol (INDERAL) 20 MG tablet Take 20 mg by mouth daily as needed.     No current facility-administered medications for this visit.    Allergies as of 07/22/2023   (No Known Allergies)    Family History  Problem Relation Age of Onset   Colon cancer Neg Hx    Inflammatory bowel disease Neg Hx     Social History   Socioeconomic History   Marital status: Single    Spouse name: Not on file  Number of children: Not on file   Years of education: Not on file   Highest education level: Not on file  Occupational History   Not on file  Tobacco Use   Smoking status: Never   Smokeless tobacco: Never  Vaping Use   Vaping status: Never Used  Substance and Sexual Activity   Alcohol use: No   Drug use: No   Sexual activity: Yes  Other Topics Concern   Not on file  Social History Narrative   Not on file   Social Drivers of Health   Financial Resource Strain: Low Risk   (08/11/2022)   Received from [facility]   Overall Financial Resource Strain (CARDIA)    Difficulty of Paying Living Expenses: Not very hard  Food Insecurity: No Food Insecurity (08/11/2022)   [facility]   Hunger Vital Sign    Worried About Running Out of Food in the Last Year: Never true    Ran Out of Food in the Last Year: Never true  Transportation Needs: No Transportation Needs (08/11/2022)   Received from [facility]   PRAPARE - Transportation    Lack of Transportation (Medical): No    Lack of Transportation (Non-Medical): No  Physical Activity: Not on file  Stress: Not on file  Social Connections: Not on file    Review of Systems: Gen: Denies fever, chills, anorexia. Denies fatigue, weakness, weight loss.  CV: Denies chest pain, palpitations, syncope, peripheral edema, and claudication. Resp: Denies dyspnea at rest, cough, wheezing, coughing up blood, and pleurisy. GI: Denies vomiting blood, jaundice, and fecal incontinence.   Denies dysphagia or odynophagia. Derm: Denies rash, itching, dry skin Psych: Denies depression, anxiety, memory loss, confusion. No homicidal or suicidal ideation.  Heme: Denies bruising, bleeding, and enlarged lymph nodes.  Physical Exam: There were no vitals taken for this visit. General:   Alert and oriented. No distress noted. Pleasant and cooperative.  Head:  Normocephalic and atraumatic. Eyes:  Conjuctiva clear without scleral icterus. Heart:  S1, S2 present without murmurs appreciated. Lungs:  Clear to auscultation bilaterally. No wheezes, rales, or rhonchi. No distress.  Abdomen:  +BS, soft, non-tender and non-distended. No rebound or guarding. No HSM or masses noted. Msk:  Symmetrical without gross deformities. Normal posture. Extremities:  Without edema. Neurologic:  Alert and  oriented x4 Psych:  Normal mood and affect.    Assessment:     Plan:  ***   [name], PA-C [facility]  Gastroenterology 07/22/2023

## 2023-07-22 ENCOUNTER — Ambulatory Visit: Admitting: Gastroenterology

## 2023-07-22 ENCOUNTER — Encounter: Payer: Self-pay | Admitting: Gastroenterology

## 2023-11-22 ENCOUNTER — Encounter: Payer: Self-pay | Admitting: Diagnostic Neuroimaging

## 2023-11-22 ENCOUNTER — Ambulatory Visit: Admitting: Diagnostic Neuroimaging

## 2023-11-29 ENCOUNTER — Ambulatory Visit: Admitting: Psychiatry

## 2023-11-29 ENCOUNTER — Encounter: Payer: Self-pay | Admitting: Psychiatry

## 2023-11-29 ENCOUNTER — Telehealth: Payer: Self-pay | Admitting: Psychiatry

## 2023-11-29 DIAGNOSIS — F411 Generalized anxiety disorder: Secondary | ICD-10-CM
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
## 2023-11-29 NOTE — Telephone Encounter (Signed)
 Toni Watson, Toni Watson are scheduled for a virtual visit with your provider today.    Just as we do with appointments in the office, we must obtain your consent to participate.  Your consent will be active for this visit and any virtual visit you may have with one of our providers in the next 365 days.    If you have a MyChart account, I can also send a copy of this consent to you electronically.  All virtual visits are billed to your insurance company just like a traditional visit in the office.  As this is a virtual visit, video technology does not allow for your provider to perform a traditional examination.  This may limit your provider's ability to fully assess your condition.  If your provider identifies any concerns that need to be evaluated in person or the need to arrange testing such as labs, EKG, etc, we will make arrangements to do so.    Although advances in technology are sophisticated, we cannot ensure that it will always work on either your end or our end.  If the connection with a video visit is poor, we may have to switch to a telephone visit.  With either a video or telephone visit, we are not always able to ensure that we have a secure connection.   I need to obtain your verbal consent now.   Are you willing to proceed with your visit today?   Toni Watson has provided verbal consent on 11/29/2023 for a virtual visit (video or telephone).   Silvano Pacini, [facility] 11/29/2023  11:01 AM

## 2023-12-14 ENCOUNTER — Ambulatory Visit: Admitting: Psychiatry

## 2023-12-21 ENCOUNTER — Ambulatory Visit (INDEPENDENT_AMBULATORY_CARE_PROVIDER_SITE_OTHER): Admitting: Psychiatry

## 2023-12-21 DIAGNOSIS — F411 Generalized anxiety disorder: Secondary | ICD-10-CM | POA: Diagnosis not present
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
## 2024-01-03 ENCOUNTER — Ambulatory Visit (INDEPENDENT_AMBULATORY_CARE_PROVIDER_SITE_OTHER): Admitting: Psychiatry

## 2024-01-03 DIAGNOSIS — F411 Generalized anxiety disorder: Secondary | ICD-10-CM

## 2024-01-03 NOTE — Progress Notes (Unsigned)
 [facility] Counselor/Therapist Progress Note  Patient ID: CHALESE PEACH, MRN: [id number],    Date: 01/03/2024  Time Spent: 48 minutes start time start time 3:05 PM end time 3:53 PM Virtual Visit via Video Note Connected with patient by a telemedicine/telehealth application, with their informed consent, and verified patient privacy and that I am speaking with the correct person using two identifiers. I discussed the limitations, risks, security and privacy concerns of performing psychotherapy and the availability of in person appointments. I also discussed with the patient that there may be a patient responsible charge related to this service. The patient expressed understanding and agreed to proceed. I discussed the treatment planning with the patient. The patient was provided an opportunity to ask questions and all were answered. The patient agreed with the plan and demonstrated an understanding of the instructions. The patient was advised to call  our office if  symptoms worsen or feel they are in a crisis state and need immediate contact.   Therapist Location: home Patient Location: home    Treatment Type: Individual Therapy  Reported Symptoms: anxiety, triggered responses, rumination, mood issues  Mental Status Exam:  Appearance:   Well Groomed     Behavior:  Appropriate  Motor:  Normal  Speech/Language:   Normal Rate  Affect:  Appropriate  Mood:  anxious  Thought process:  normal  Thought content:    WNL  Sensory/Perceptual disturbances:    WNL  Orientation:  oriented to person, place, time/date, and situation  Attention:  Good  Concentration:  Good  Memory:  WNL  Fund of knowledge:   Good  Insight:    Good  Judgment:   Good  Impulse Control:  Good   Risk Assessment: Danger to Self:  No Self-injurious Behavior: No Danger to Others: No Duty to Warn:no Physical Aggression / Violence:No  Access to Firearms a concern: No  Gang Involvement:No   Subjective: Met  with patient via virtual session. She shared she got engaged over the weekend. She shared it has been a mix of emotions. She went on to share that got her dress already. She went on to share that her sister was upset due to her not getting to go with them. Encouraged her to remind herself that it is her day and she can focus on what she wants. Patient stated with thinking about the wedding issues with her dad are surfacing. She shared she used to feel her dad was perfect and than she found out that he has had inappropriate behaviors and now she struggles communicating with him. Patient went on to share that she is most upset over the way dad talks about or to her son. SUDS level 6 negative cognition I'm a bad parent guilt and frustration in chest. Patient was SUDS level to 3 and she was able to see that her judgement with her son is more appropriate that her dad's judgement.  She was encouraged to remind herself of the truth and to focus on feeling more confident with her choices.  Interventions: Eye Movement Desensitization and Reprocessing (EMDR) and Insight-Oriented  Diagnosis:   ICD-10-CM   1. Generalized anxiety disorder  F41.1       Plan: Patient is to practice grounding skills and DBT skills taught in session.  Patient is to work on reminding herself that she can trust her choices and keep perspective when it comes to her dad and the way he interacts with her son.  Patient is to give  herself permission to distance from her grandmother and mother as she has been doing to take care of her son's needs. Patient is to work on releasing negative emotions throughout the day including working on regular breathing exercises.   Silvano Pacini, [facility]

## 2024-01-17 ENCOUNTER — Ambulatory Visit: Admitting: Psychiatry

## 2024-01-17 DIAGNOSIS — F411 Generalized anxiety disorder: Secondary | ICD-10-CM
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
## 2024-02-15 ENCOUNTER — Ambulatory Visit: Admitting: Psychiatry

## 2024-02-15 DIAGNOSIS — F411 Generalized anxiety disorder: Secondary | ICD-10-CM

## 2024-02-15 NOTE — Progress Notes (Unsigned)
"   °      [facility] Counselor/Therapist Progress Note  Patient ID: Toni Watson, MRN: [id number],    Date: 02/15/2024  Time Spent: 40 minutes start time 4:01 PM end time 4:41 PM Virtual Visit via Video Note Connected with patient by a telemedicine/telehealth application, with their informed consent, and verified patient privacy and that I am speaking with the correct person using two identifiers. I discussed the limitations, risks, security and privacy concerns of performing psychotherapy and the availability of in person appointments. I also discussed with the patient that there may be a patient responsible charge related to this service. The patient expressed understanding and agreed to proceed. I discussed the treatment planning with the patient. The patient was provided an opportunity to ask questions and all were answered. The patient agreed with the plan and demonstrated an understanding of the instructions. The patient was advised to call  our office if  symptoms worsen or feel they are in a crisis state and need immediate contact.   Therapist Location: home Patient Location: home    Treatment Type: Individual Therapy  Reported Symptoms: anxiety, triggered responses, sleep issues, rumination, focusing issues, obsessive thinking, irritability  Mental Status Exam:  Appearance:   Well Groomed     Behavior:  Appropriate  Motor:  Normal  Speech/Language:   Normal Rate  Affect:  Appropriate  Mood:  anxious  Thought process:  normal  Thought content:    WNL  Sensory/Perceptual disturbances:    WNL  Orientation:  oriented to person, place, time/date, and situation  Attention:  Good  Concentration:  Good  Memory:  WNL  Fund of knowledge:   Good  Insight:    Good  Judgment:   Good  Impulse Control:  Good   Risk Assessment: Danger to Self:  No Self-injurious Behavior: No Danger to Others: No Duty to Warn:no Physical Aggression / Violence:No  Access to Firearms a concern: No   Gang Involvement:No   Subjective: Met with patient via virtual session. She shared she is feeling she is getting into blank moods and not present. She shared she is thinking herself to death. She shared she feels she is stressing herself to the point of what is going on. Had her think through what is triggering for her. Discussed how the transition is making her anxious.  She shared that the letting go of control is the hardest part of the whole situation. SUDS level 6 negative cognition I going to fail, felt discouragement and sadness in her head.  Patient was able to reduce as level 2 to and realize that she does not have to get things perfect and exact.  Patient was able to think through ways to talk or self through any anxiety that she has regarding failing  Interventions: Eye Movement Desensitization and Reprocessing (EMDR) and Insight-Oriented  Diagnosis:   ICD-10-CM   1. Generalized anxiety disorder  F41.1       Plan:   Patient is to practice grounding skills and DBT skills taught in session.  Patient is to work on her self-talk over the next few weeks..  Patient is to give herself permission to distance from her grandmother and mother as she has been doing to take care of her son's needs. Patient is to work on releasing negative emotions throughout the day including working on regular breathing exercises.   Silvano Pacini, [facility]                   "

## 2024-02-29 ENCOUNTER — Ambulatory Visit: Admitting: Psychiatry

## 2024-02-29 DIAGNOSIS — F411 Generalized anxiety disorder: Secondary | ICD-10-CM | POA: Diagnosis not present
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
## 2024-03-14 ENCOUNTER — Ambulatory Visit: Admitting: Psychiatry

## 2024-03-14 DIAGNOSIS — F411 Generalized anxiety disorder: Secondary | ICD-10-CM

## 2024-03-14 NOTE — Progress Notes (Addendum)
 "       [facility] Counselor/Therapist Progress Note  Patient ID: Toni Watson, MRN: [id number],    Date: 03/14/2024  Time Spent: 48 minutes start time 3:59 PM end time 4:47 PM Virtual Visit via Video Note Connected with patient by a telemedicine/telehealth application, with their informed consent, and verified patient privacy and that I am speaking with the correct person using two identifiers. I discussed the limitations, risks, security and privacy concerns of performing psychotherapy and the availability of in person appointments. I also discussed with the patient that there may be a patient responsible charge related to this service. The patient expressed understanding and agreed to proceed. I discussed the treatment planning with the patient. The patient was provided an opportunity to ask questions and all were answered. The patient agreed with the plan and demonstrated an understanding of the instructions. The patient was advised to call  our office if  symptoms worsen or feel they are in a crisis state and need immediate contact.   Therapist Location: home Patient Location: home    Treatment Type: Individual Therapy  Reported Symptoms: anxiety,sadness, triggered responses, rumination, sleep issues, fatigue  Mental Status Exam:  Appearance:   Casual     Behavior:  Appropriate  Motor:  Normal  Speech/Language:   Normal Rate  Affect:  Appropriate  Mood:  sad  Thought process:  normal  Thought content:    WNL  Sensory/Perceptual disturbances:    WNL  Orientation:  oriented to person, place, time/date, and situation  Attention:  Good  Concentration:  Good  Memory:  WNL  Fund of knowledge:   Good  Insight:    Good  Judgment:   Good  Impulse Control:  Good   Risk Assessment: Danger to Self:  No Self-injurious Behavior: No Danger to Others: No Duty to Warn:no Physical Aggression / Violence:No  Access to Firearms a concern: No  Gang Involvement:No   Subjective: Met  with patient via virtual session. She shared that her mom reached out to talk with her but than it did not happen.  Patient went on to share that her sister stated that her mother is planning on approaching her when there is another event in public.  Discussed how that would be very awkward and more challenging for patient.  Had patient think through what she would truly want to be able to say to her mother and what she would want to ask her mother to try and get some closure on their past situation.  Patient was able to share that she felt she was never a priority with her mom that guys were much more important for her.  She even shared there was a time when the school had called because there had been an event that she had attended and it was time to come and pick her up even though it was after hours and her mother said it was not her job and left her there.  She shared that her dad did come and get her which is a good thing.  Patient went on to explain that she has more positive nurturing memories of her grandfather and her father than she does with her mother which makes it really hard for her to feel very close to her mom.  Patient was encouraged to know the situation is just a is and it is not a right or wrong but she has to decide what she wants to release from it so she does not carry  it on into her marriage.  Patient was able to see that being able to work through some of the issues with her mom could be very helpful and beneficial to her current relationships.  She shared she is working hard to be a different mom and to be more present for her son and she feels that she has done that for the most part.  She went on to share she she still has difficulty with wondering whether or not she is making the best decisions as a parent.  Discussed how parenting is very challenging and encouraged her to read the book have a new kid by Friday to help her feel like she has more tools to think through things  differently.  Patient agreed to journal the situation with her mother and to take some time to think through it further so she will know how she wants to approach her and what she would like to get out of the relationship if possible.  Interventions: Solution-Oriented/Positive Psychology and Insight-Oriented  Diagnosis:   ICD-10-CM   1. Generalized anxiety disorder  F41.1       Plan: Patient is to practice grounding skills and DBT skills taught in session.  Patient is to journal and trying to think through what she would like to see happen in the relationship with her mother and what she would want her mother to know.  Patient is to work on spending positive time with her fianc and work on communication issues.  Patient is to continue working on setting boundaries with family members that are very triggering for her. Patient is to work on releasing negative emotions throughout the day including working on regular breathing exercises.     Silvano Pacini, [facility]                   "
